# Patient Record
Sex: Female | Born: 1970 | Race: Black or African American | Hispanic: No | Marital: Single | Smoking: Never smoker
Health system: Southern US, Community
[De-identification: ages and names within clinical notes are randomized; demographics above are authoritative.]

## PROBLEM LIST (undated history)

## (undated) DIAGNOSIS — K769 Liver disease, unspecified: Secondary | ICD-10-CM

## (undated) HISTORY — PX: CHOLECYSTECTOMY: SHX55

## (undated) HISTORY — PX: ABDOMINAL HYSTERECTOMY: SHX81

---

## 2016-02-07 ENCOUNTER — Encounter (HOSPITAL_BASED_OUTPATIENT_CLINIC_OR_DEPARTMENT_OTHER): Payer: Self-pay | Admitting: *Deleted

## 2016-02-07 ENCOUNTER — Emergency Department (HOSPITAL_BASED_OUTPATIENT_CLINIC_OR_DEPARTMENT_OTHER)
Admission: EM | Admit: 2016-02-07 | Discharge: 2016-02-07 | Disposition: A | Discharge status: Home / Self Care | Payer: Medicaid Other | Attending: Emergency Medicine | Admitting: Emergency Medicine

## 2016-02-07 ENCOUNTER — Emergency Department (HOSPITAL_BASED_OUTPATIENT_CLINIC_OR_DEPARTMENT_OTHER): Payer: Medicaid Other

## 2016-02-07 DIAGNOSIS — R05 Cough: Secondary | ICD-10-CM | POA: Insufficient documentation

## 2016-02-07 NOTE — ED Notes (Signed)
Cough and URI sx x 3 weeks

## 2016-11-19 ENCOUNTER — Encounter (HOSPITAL_BASED_OUTPATIENT_CLINIC_OR_DEPARTMENT_OTHER): Payer: Self-pay | Admitting: *Deleted

## 2016-11-19 ENCOUNTER — Emergency Department (HOSPITAL_BASED_OUTPATIENT_CLINIC_OR_DEPARTMENT_OTHER)
Admission: EM | Admit: 2016-11-19 | Discharge: 2016-11-19 | Disposition: A | Discharge status: Home / Self Care | Payer: Commercial Managed Care - HMO | Attending: Emergency Medicine | Admitting: Emergency Medicine

## 2016-11-19 DIAGNOSIS — H1033 Unspecified acute conjunctivitis, bilateral: Secondary | ICD-10-CM | POA: Diagnosis not present

## 2016-11-19 DIAGNOSIS — J069 Acute upper respiratory infection, unspecified: Secondary | ICD-10-CM | POA: Diagnosis not present

## 2016-11-19 DIAGNOSIS — R05 Cough: Secondary | ICD-10-CM | POA: Diagnosis present

## 2016-11-19 DIAGNOSIS — B9789 Other viral agents as the cause of diseases classified elsewhere: Secondary | ICD-10-CM

## 2016-11-19 HISTORY — DX: Liver disease, unspecified: K76.9

## 2016-11-19 MED ORDER — ERYTHROMYCIN 5 MG/GM OP OINT: TOPICAL_OINTMENT | OPHTHALMIC | 0 refills | Status: DC

## 2016-11-19 MED ORDER — BENZONATATE 100 MG PO CAPS: 100.0000 mg | ORAL_CAPSULE | Freq: Three times a day (TID) | ORAL | 0 refills | Status: DC

## 2016-11-19 NOTE — Discharge Instructions (Signed)
Take tylenol 2 pills 4 times a day and motrin 4 pills 3 times a day.  Drink plenty of fluids.  Return for worsening shortness of breath, headache, confusion. Follow up with your family doctor.   

## 2016-11-19 NOTE — ED Triage Notes (Signed)
Pt reports cough and sinus congestion x over one week. Cough was productive last week, now is unable to experctorate. Has not used any otc meds. Denies any fevers.

## 2016-11-19 NOTE — ED Notes (Signed)
ED Provider at bedside. 

## 2016-11-19 NOTE — ED Provider Notes (Signed)
[facility]   CSN: [id number] Arrival date & time: 11/19/16  0750     History   Chief Complaint Chief Complaint  Patient presents with  . Cough    HPI Heather Bass is a 45 y.o. female.  [name] with a chief complaint of cough congestion. Going on for the past week or so. Now is nonproductive. Denies fevers denies shortness breath. Has some mild facial swelling. Denies sore throat denies vomiting or diarrhea. Having bilateral eye discharge. Is thick in the morning and has to open her eyes with her hands. Unsure of sick contacts.   The history is provided by the patient.  Cough  This is a new problem. The current episode started more than 1 week ago. The problem occurs constantly. The problem has not changed since onset.The cough is non-productive. There has been no fever. Associated symptoms include rhinorrhea and eye redness. Pertinent negatives include no chest pain, no chills, no sweats, no ear congestion, no ear pain, no headaches, no sore throat, no myalgias, no shortness of breath and no wheezing. She has tried cough syrup and decongestants for the symptoms. The treatment provided mild relief. She is not a smoker.    Past Medical History:  Diagnosis Date  . Liver disorder     There are no active problems to display for this patient.   Past Surgical History:  Procedure Laterality Date  . ABDOMINAL HYSTERECTOMY    . CHOLECYSTECTOMY      OB History    No data available       Home Medications    Prior to Admission medications   Medication Sig Start Date End Date Taking? Authorizing Provider  benzonatate (TESSALON) 100 MG capsule Take 1 capsule (100 mg total) by mouth every 8 (eight) hours. 11/19/16   Melene Planan Liller Yohn, DO  erythromycin ophthalmic ointment Place a 1/2 inch ribbon of ointment into the lower eyelid four times a day for 7 days. 11/19/16   Melene Planan Meaghann Choo, DO    Family History History reviewed. No pertinent family history.  Social  History Social History  Substance Use Topics  . Smoking status: Never Smoker  . Smokeless tobacco: Never Used  . Alcohol use Yes     Comment: beer occasionally     Allergies   Patient has no known allergies.   Review of Systems Review of Systems  Constitutional: Negative for chills and fever.  HENT: Positive for congestion, facial swelling and rhinorrhea. Negative for ear pain and sore throat.   Eyes: Positive for redness. Negative for visual disturbance.  Respiratory: Positive for cough. Negative for shortness of breath and wheezing.   Cardiovascular: Negative for chest pain and palpitations.  Gastrointestinal: Negative for nausea and vomiting.  Genitourinary: Negative for dysuria and urgency.  Musculoskeletal: Negative for arthralgias and myalgias.  Skin: Negative for pallor and wound.  Neurological: Negative for dizziness and headaches.     Physical Exam Updated Vital Signs BP (!) 155/105 (BP Location: Left Arm)   Pulse 90   Temp 97.7 F (36.5 C) (Oral)   Resp 18   Ht 5\' 11"  (1.803 m)   Wt 239 lb (108.4 kg)   SpO2 97%   BMI 33.33 kg/m   Physical Exam  Constitutional: She is oriented to person, place, and time. She appears well-developed and well-nourished. No distress.  HENT:  Head: Normocephalic and atraumatic.  Swollen turbinates, posterior nasal drip, no noted sinus ttp, tm normal bilaterally.   Vesicles noted in and  around mouth   Eyes: EOM are normal. Pupils are equal, round, and reactive to light. Right eye exhibits discharge. Left eye exhibits discharge.  Injection bilaterally  Neck: Normal range of motion. Neck supple.  Cardiovascular: Normal rate and regular rhythm.  Exam reveals no gallop and no friction rub.   No murmur heard. Pulmonary/Chest: Effort normal. She has no wheezes. She has no rales.  Abdominal: Soft. She exhibits no distension. There is no tenderness.  Musculoskeletal: She exhibits no edema or tenderness.  Neurological: She is alert  and oriented to person, place, and time.  Skin: Skin is warm and dry. She is not diaphoretic.  Psychiatric: She has a normal mood and affect. Her behavior is normal.  Nursing note and vitals reviewed.    ED Treatments / Results  Labs (all labs ordered are listed, but only abnormal results are displayed) Labs Reviewed - No data to display  EKG  EKG Interpretation None       Radiology No results found.  Procedures Procedures (including critical care time)  Medications Ordered in ED Medications - No data to display   Initial Impression / Assessment and Plan / ED Course  I have reviewed the triage vital signs and the nursing notes.  Pertinent labs & imaging results that were available during my care of the patient were reviewed by me and considered in my medical decision making (see chart for details).  Clinical Course     [name] With a chief complaint of cough and congestion. Going on for the past week. Clinically has conjunctivitis. Will treat with erythromycin ointment. Clear lung sounds increased work of breathing. Suspect likely early hand-foot-and-mouth disease with vesicular lesions in the mouth and on the face. Will defer x-ray at this time without fever. PCP follow-up.    Final Clinical Impressions(s) / ED Diagnoses   Final diagnoses:  Viral URI with cough  Acute conjunctivitis of both eyes, unspecified acute conjunctivitis type    New Prescriptions New Prescriptions   BENZONATATE (TESSALON) 100 MG CAPSULE    Take 1 capsule (100 mg total) by mouth every 8 (eight) hours.   ERYTHROMYCIN OPHTHALMIC OINTMENT    Place a 1/2 inch ribbon of ointment into the lower eyelid four times a day for 7 days.     Melene Planan Monterio Bob, DO 11/19/16 [phone]

## 2017-07-26 ENCOUNTER — Emergency Department (HOSPITAL_BASED_OUTPATIENT_CLINIC_OR_DEPARTMENT_OTHER)
Admission: EM | Admit: 2017-07-26 | Discharge: 2017-07-26 | Disposition: A | Discharge status: Home / Self Care | Payer: Medicare Other | Attending: Emergency Medicine | Admitting: Emergency Medicine

## 2017-07-26 ENCOUNTER — Encounter (HOSPITAL_BASED_OUTPATIENT_CLINIC_OR_DEPARTMENT_OTHER): Payer: Self-pay | Admitting: Emergency Medicine

## 2017-07-26 DIAGNOSIS — A599 Trichomoniasis, unspecified: Secondary | ICD-10-CM | POA: Diagnosis not present

## 2017-07-26 DIAGNOSIS — N3000 Acute cystitis without hematuria: Secondary | ICD-10-CM | POA: Insufficient documentation

## 2017-07-26 DIAGNOSIS — B9689 Other specified bacterial agents as the cause of diseases classified elsewhere: Secondary | ICD-10-CM

## 2017-07-26 DIAGNOSIS — N760 Acute vaginitis: Secondary | ICD-10-CM | POA: Diagnosis not present

## 2017-07-26 DIAGNOSIS — N898 Other specified noninflammatory disorders of vagina: Secondary | ICD-10-CM | POA: Diagnosis present

## 2017-07-26 DIAGNOSIS — Z79899 Other long term (current) drug therapy: Secondary | ICD-10-CM | POA: Diagnosis not present

## 2017-07-26 LAB — URINALYSIS, MICROSCOPIC (REFLEX)

## 2017-07-26 LAB — URINALYSIS, ROUTINE W REFLEX MICROSCOPIC
Bilirubin Urine: NEGATIVE
Glucose, UA: NEGATIVE mg/dL
Hgb urine dipstick: NEGATIVE
KETONES UR: NEGATIVE mg/dL
NITRITE: NEGATIVE
PH: 6.5 (ref 5.0–8.0)
Protein, ur: NEGATIVE mg/dL
Specific Gravity, Urine: 1.014 (ref 1.005–1.030)

## 2017-07-26 LAB — WET PREP, GENITAL
Sperm: NONE SEEN
YEAST WET PREP: NONE SEEN

## 2017-07-26 MED ORDER — METRONIDAZOLE 500 MG PO TABS: 500.0000 mg | ORAL_TABLET | Freq: Two times a day (BID) | ORAL | 0 refills | Status: DC

## 2017-07-26 MED ORDER — CEPHALEXIN 500 MG PO CAPS: 500.0000 mg | ORAL_CAPSULE | Freq: Four times a day (QID) | ORAL | 0 refills | Status: DC

## 2017-07-26 NOTE — ED Provider Notes (Signed)
[facility]   CSN: [id number] Arrival date & time: 07/26/17  0825     History   Chief Complaint Chief Complaint  Patient presents with  . Vaginal Discharge    HPI Heather Bass is a 46 y.o. female who presents with 4 days of white vaginal discharge and dysuria. Patient states that the discharge is white and malodorous. Patient also reports some dysuria that has been ongoing. Patient states that she was last sexually active 3 weeks ago. Patient has one partner. They do not use any protection and patient is on any birth control. Patient has a history of STDs cannot recall which one. She was treated several years ago. Patient denies any fevers/chills, chest pain, difficulty breathing, abdominal pain, hematuria, vaginal bleeding.  The history is provided by the patient.    Past Medical History:  Diagnosis Date  . Liver disorder     There are no active problems to display for this patient.   Past Surgical History:  Procedure Laterality Date  . ABDOMINAL HYSTERECTOMY    . CHOLECYSTECTOMY      OB History    No data available       Home Medications    Prior to Admission medications   Medication Sig Start Date End Date Taking? Authorizing Provider  citalopram (CELEXA) 20 MG tablet Take 20 mg by mouth daily.   Yes [provider]  furosemide (LASIX) 20 MG tablet Take 20 mg by mouth.   Yes [provider]  predniSONE (DELTASONE) 20 MG tablet Take 20 mg by mouth daily with breakfast.   Yes [provider]  spironolactone (ALDACTONE) 50 MG tablet Take 50 mg by mouth daily.   Yes [provider]  cephALEXin (KEFLEX) 500 MG capsule Take 1 capsule (500 mg total) by mouth 4 (four) times daily. 07/26/17   Maxwell CaulLayden, Charlyne Robertshaw A, PA-C  metroNIDAZOLE (FLAGYL) 500 MG tablet Take 1 tablet (500 mg total) by mouth 2 (two) times daily. 07/26/17   Maxwell CaulLayden, Lamberto Dinapoli A, PA-C    Family History No family history on file.  Social  History Social History  Substance Use Topics  . Smoking status: Never Smoker  . Smokeless tobacco: Never Used  . Alcohol use Yes     Comment: beer occasionally     Allergies   Patient has no known allergies.   Review of Systems Review of Systems  Constitutional: Negative for fever.  Respiratory: Negative for shortness of breath.   Cardiovascular: Negative for chest pain.  Gastrointestinal: Negative for abdominal pain, nausea and vomiting.  Genitourinary: Positive for vaginal discharge. Negative for dysuria, hematuria, vaginal bleeding and vaginal pain.     Physical Exam Updated Vital Signs BP 130/88   Pulse 85   Temp 97.6 F (36.4 C) (Oral)   Resp 16   Ht 5\' 11"  (1.803 m)   Wt 95.3 kg (210 lb)   SpO2 99%   BMI 29.29 kg/m   Physical Exam  Constitutional: She is oriented to person, place, and time. She appears well-developed and well-nourished.  Sitting comfortably on examination table  HENT:  Head: Normocephalic and atraumatic.  Mouth/Throat: Oropharynx is clear and moist and mucous membranes are normal.  Eyes: Pupils are equal, round, and reactive to light. Conjunctivae, EOM and lids are normal.  Neck: Full passive range of motion without pain.  Cardiovascular: Normal rate, regular rhythm, normal heart sounds and normal pulses.  Exam reveals no gallop and no friction rub.   No murmur heard. Pulmonary/Chest:  Effort normal and breath sounds normal.  Abdominal: Soft. Normal appearance. There is no tenderness. There is no rigidity, no guarding and no CVA tenderness (bilaterally).  Genitourinary: Cervix exhibits no motion tenderness and no friability. Right adnexum displays no mass and no tenderness. Left adnexum displays no mass and no tenderness. Vaginal discharge found.  Genitourinary Comments: The exam was performed with a chaperone present. Normal external female genitalia. No lesions, rash, or sores. White, malodorous discharge noted in the vaginal canal. Cervix is  without friability. No CMT. No adnexal mass, tenderness.   Musculoskeletal: Normal range of motion.  Neurological: She is alert and oriented to person, place, and time.  Skin: Skin is warm and dry. Capillary refill takes less than 2 seconds.  Psychiatric: She has a normal mood and affect. Her speech is normal.  Nursing note and vitals reviewed.    ED Treatments / Results  Labs (all labs ordered are listed, but only abnormal results are displayed) Labs Reviewed  WET PREP, GENITAL - Abnormal; Notable for the following:       Result Value   Trich, Wet Prep PRESENT (*)    Clue Cells Wet Prep HPF POC PRESENT (*)    WBC, Wet Prep HPF POC MANY (*)    All other components within normal limits  URINALYSIS, ROUTINE W REFLEX MICROSCOPIC - Abnormal; Notable for the following:    Leukocytes, UA MODERATE (*)    All other components within normal limits  URINALYSIS, MICROSCOPIC (REFLEX) - Abnormal; Notable for the following:    Bacteria, UA FEW (*)    Squamous Epithelial / LPF 0-5 (*)    All other components within normal limits  HIV ANTIBODY (ROUTINE TESTING)  RPR  GC/CHLAMYDIA PROBE AMP ([place]) NOT AT [facility]    EKG  EKG Interpretation None       Radiology No results found.  Procedures Procedures (including critical care time)  Medications Ordered in ED Medications - No data to display   Initial Impression / Assessment and Plan / ED Course  I have reviewed the triage vital signs and the nursing notes.  Pertinent labs & imaging results that were available during my care of the patient were reviewed by me and considered in my medical decision making (see chart for details).     Were 46-year-old female who presents with 4 days of white vaginal discharge and dysuria. No fevers/chills. No abdominal pain. No nausea/vomiting. Patient also requesting to be checked for STDs while here. Patient is afebrile, non-toxic appearing, sitting comfortably on examination table. Vital signs  reviewed and stable. Consider UTI versus STD versus BV. Plan to obtain UA for evaluation. Will also plan to do pelvic for further evaluation.  Pelvic as documented above. No evidence of cervical friability. No CMT. Pelvic is not concerning for PID. There was white, malodorous vaginal discharge, concerning for BV.   Patient unable to provide a urine sample at this time. Will in and out cath for urine sample.  Wet prep reviewed. Positive for BV and Trichomonas. Will plan to treat accordingly. UA shows some leukocytes, pyuria, bacteriuria.  Patient has history of autoimmune hepatitis. She is followed by [facility]. Records reviewed show that patient has reached the labs in July 2018 that showed normal AST and ALT. no hepatic dosing of Flagyl indicated at this time.  Discussed results with patient. Instructed patient follow-up with her primary care doctor in 1 week to have her labs, specifically her liver functions rechecked. Return precautions discussed. Patient  expresses understanding and agreement plan.  Final Clinical Impressions(s) / ED Diagnoses   Final diagnoses:  BV (bacterial vaginosis)  Trichomonas infection  Acute cystitis without hematuria    New Prescriptions Discharge Medication List as of 07/26/2017 12:14 PM    START taking these medications   Details  cephALEXin (KEFLEX) 500 MG capsule Take 1 capsule (500 mg total) by mouth 4 (four) times daily., Starting Mon 07/26/2017, Print    metroNIDAZOLE (FLAGYL) 500 MG tablet Take 1 tablet (500 mg total) by mouth 2 (two) times daily., Starting Mon 07/26/2017, Print         Maxwell Caul, PA-C 07/26/17 1746    Arby Barrette, MD 08/05/17 [phone]

## 2017-07-26 NOTE — ED Triage Notes (Signed)
Pt wants to be checked for STD's.  Pt having vaginal discharge, foul smell, and dysuria for three days.  No fever.

## 2017-07-26 NOTE — Discharge Instructions (Signed)
As we discussed, you will be notified in a few days if the remaining labs are abnormal. If he do not hear anything that is continues.  Take Flagyl as directed.  It is very important that you do not consume any alcohol while taking this medication as it will cause you to become violently ill.  Take the other antibiotic as prescribed.  As we discussed, you need to follow up with her primary care doctor in 1 week to have her labs redrawn. Specifically they need to check her liver functions to make sure that they're normal.  Return to the emergency department for any worsening pain, fever, nausea/vomiting, difficulty breathing or any other worsening or concerning symptoms.

## 2017-07-27 LAB — RPR, QUANT+TP ABS (REFLEX): T Pallidum Abs: POSITIVE — AB

## 2017-07-27 LAB — HIV ANTIBODY (ROUTINE TESTING W REFLEX): HIV SCREEN 4TH GENERATION: NONREACTIVE

## 2017-07-27 LAB — RPR: RPR Ser Ql: REACTIVE — AB

## 2017-07-28 LAB — GC/CHLAMYDIA PROBE AMP (~~LOC~~) NOT AT ARMC
CHLAMYDIA, DNA PROBE: NEGATIVE
Neisseria Gonorrhea: NEGATIVE

## 2018-02-06 ENCOUNTER — Encounter (HOSPITAL_BASED_OUTPATIENT_CLINIC_OR_DEPARTMENT_OTHER): Payer: Self-pay | Admitting: Emergency Medicine

## 2018-02-06 ENCOUNTER — Other Ambulatory Visit: Payer: Self-pay

## 2018-02-06 ENCOUNTER — Emergency Department (HOSPITAL_BASED_OUTPATIENT_CLINIC_OR_DEPARTMENT_OTHER)
Admission: EM | Admit: 2018-02-06 | Discharge: 2018-02-06 | Disposition: A | Discharge status: Home / Self Care | Payer: Medicare Other | Attending: Emergency Medicine | Admitting: Emergency Medicine

## 2018-02-06 DIAGNOSIS — Z79899 Other long term (current) drug therapy: Secondary | ICD-10-CM | POA: Insufficient documentation

## 2018-02-06 DIAGNOSIS — R1110 Vomiting, unspecified: Secondary | ICD-10-CM | POA: Diagnosis present

## 2018-02-06 DIAGNOSIS — R112 Nausea with vomiting, unspecified: Secondary | ICD-10-CM | POA: Diagnosis not present

## 2018-02-06 DIAGNOSIS — R1013 Epigastric pain: Secondary | ICD-10-CM | POA: Diagnosis not present

## 2018-02-06 MED ORDER — ONDANSETRON 8 MG PO TBDP
8.0000 mg | ORAL_TABLET | Freq: Once | ORAL | Status: AC
  Administered 2018-02-06: 8 mg via ORAL
  Filled 2018-02-06: qty 1

## 2018-02-06 MED ORDER — ONDANSETRON 4 MG PO TBDP: 4.0000 mg | ORAL_TABLET | ORAL | 0 refills | Status: AC | PRN

## 2018-02-06 MED ORDER — PANTOPRAZOLE SODIUM 20 MG PO TBEC
20.0000 mg | DELAYED_RELEASE_TABLET | Freq: Once | ORAL | Status: DC
  Filled 2018-02-06: qty 1

## 2018-02-06 MED ORDER — ONDANSETRON 4 MG PO TBDP
4.0000 mg | ORAL_TABLET | Freq: Once | ORAL | Status: AC
  Administered 2018-02-06: 4 mg via ORAL
  Filled 2018-02-06: qty 1

## 2018-02-06 MED ORDER — PANTOPRAZOLE SODIUM 40 MG PO TBEC
DELAYED_RELEASE_TABLET | ORAL | Status: AC
  Filled 2018-02-06: qty 1

## 2018-02-06 MED ORDER — PANTOPRAZOLE SODIUM 20 MG PO TBEC: 20.0000 mg | DELAYED_RELEASE_TABLET | Freq: Every day | ORAL | 0 refills | Status: AC

## 2018-02-06 MED ORDER — PANTOPRAZOLE SODIUM 40 MG PO TBEC
40.0000 mg | DELAYED_RELEASE_TABLET | Freq: Once | ORAL | Status: AC
  Administered 2018-02-06: 40 mg via ORAL

## 2018-02-06 NOTE — ED Triage Notes (Addendum)
Pt reports vomiting that started Saturday (yesterday) afternoon. Reports ~ 6 episodes, and "a little fever", and "a little bit" of abd pain. Pt also reports chest pain.

## 2018-02-06 NOTE — ED Provider Notes (Signed)
[facility] EMERGENCY DEPARTMENT Provider Note   CSN: [id number] Arrival date & time: 02/06/18  0004     History   Chief Complaint Chief Complaint  Patient presents with  . Vomiting    HPI Heather Bass is a 47 y.o. female.  HPI Reports was lying across her bed yesterday midday.  Quite suddenly she felt nauseated and went into the bathroom and threw up several times.  She then laid down again and reports she rested for a while.  Then got a second episode of vomiting that she reports lasted for about 30 minutes.  Patient reports she had some epigastric pain.  Possible subjective fever.  No diarrhea.  Patient had very prolonged ED wait time.  She has been here for 7 hours.  She reports no further vomiting since coming to the emergency department.  She reports that at one point some pain radiated up into her chest.  Had burning quality.  At this time, she reports some mild diffuse abdominal discomfort.  No further nausea.  History of autoimmune cirrhosis.  He does see a gastroenterologist.  She reports she is compliant with her medications. Past Medical History:  Diagnosis Date  . Liver disorder     There are no active problems to display for this patient.   Past Surgical History:  Procedure Laterality Date  . ABDOMINAL HYSTERECTOMY    . CHOLECYSTECTOMY      OB History    No data available       Home Medications    Prior to Admission medications   Medication Sig Start Date End Date Taking? Authorizing Provider  cephALEXin (KEFLEX) 500 MG capsule Take 1 capsule (500 mg total) by mouth 4 (four) times daily. 07/26/17   Maxwell CaulLayden, Lindsey A, PA-C  citalopram (CELEXA) 20 MG tablet Take 20 mg by mouth daily.    [provider]  furosemide (LASIX) 20 MG tablet Take 20 mg by mouth.    [provider]  metroNIDAZOLE (FLAGYL) 500 MG tablet Take 1 tablet (500 mg total) by mouth 2 (two) times daily. 07/26/17   Maxwell CaulLayden, Lindsey A, PA-C  ondansetron (ZOFRAN ODT) 4  MG disintegrating tablet Take 1 tablet (4 mg total) by mouth every 4 (four) hours as needed for nausea or vomiting. 02/06/18   Arby BarrettePfeiffer, Shavonte Zhao, MD  pantoprazole (PROTONIX) 20 MG tablet Take 1 tablet (20 mg total) by mouth daily. 02/06/18   Arby BarrettePfeiffer, Kensey Luepke, MD  predniSONE (DELTASONE) 20 MG tablet Take 20 mg by mouth daily with breakfast.    [provider]  spironolactone (ALDACTONE) 50 MG tablet Take 50 mg by mouth daily.    [provider]    Family History No family history on file.  Social History Social History   Tobacco Use  . Smoking status: Never Smoker  . Smokeless tobacco: Never Used  Substance Use Topics  . Alcohol use: Yes    Comment: beer occasionally  . Drug use: No     Allergies   Patient has no known allergies.   Review of Systems Review of Systems 10 Systems reviewed and are negative for acute change except as noted in the HPI.   Physical Exam Updated Vital Signs BP 98/67 (BP Location: Left Arm)   Pulse 68   Temp 98.4 F (36.9 C) (Oral)   Resp 16   Ht 5\' 11"  (1.803 m)   Wt 106.1 kg (233 lb 14.4 oz)   SpO2 100%   BMI 32.62 kg/m   Physical Exam  Constitutional: She appears well-developed and well-nourished. No distress.  Patient is resting quietly as I enter the room.  She is alert and nontoxic.  No distress.  HENT:  Head: Normocephalic and atraumatic.  Mouth/Throat: Oropharynx is clear and moist.  Eyes: Conjunctivae and EOM are normal.  No scleral icterus.  Neck: Neck supple.  Cardiovascular: Normal rate, regular rhythm, normal heart sounds and intact distal pulses.  No murmur heard. Pulmonary/Chest: Effort normal and breath sounds normal. No respiratory distress.  Abdominal: Soft. Bowel sounds are normal. She exhibits no distension. There is no tenderness. There is no guarding.  Patient's abdomen is nondistended.  Soft.  Patient endorses mild discomfort diffusely.  Mildly more epigastrium as compared to lower abdomen.    Musculoskeletal: Normal range of motion. She exhibits edema. She exhibits no tenderness.  Trace edema bilateral lower extremities.  Neurological: She is alert. She exhibits normal muscle tone. Coordination normal.  Patient's mental status is clear.  Her speech is normal with situationally appropriate conversation.  No evidence of somnolence or cognitive dysfunction.  Skin: Skin is warm and dry.  Psychiatric: She has a normal mood and affect.  Nursing note and vitals reviewed.    ED Treatments / Results  Labs (all labs ordered are listed, but only abnormal results are displayed) Labs Reviewed - No data to display  EKG  EKG Interpretation  Date/Time:  Sunday February 06 2018 00:25:14 EST Ventricular Rate:  99 PR Interval:  168 QRS Duration: 82 QT Interval:  358 QTC Calculation: 459 R Axis:   70 Text Interpretation:  Normal sinus rhythm Possible Left atrial enlargement Borderline ECG No previous ECGs available Confirmed by Molpus, John ([id number]) on 02/06/2018 1:02:33 AM       Radiology No results found.  Procedures Procedures (including critical care time)  Medications Ordered in ED Medications  ondansetron (ZOFRAN-ODT) disintegrating tablet 4 mg (not administered)  pantoprazole (PROTONIX) EC tablet 40 mg (not administered)  ondansetron (ZOFRAN-ODT) disintegrating tablet 8 mg (8 mg Oral Given 02/06/18 0617)     Initial Impression / Assessment and Plan / ED Course  I have reviewed the triage vital signs and the nursing notes.  Pertinent labs & imaging results that were available during my care of the patient were reviewed by me and considered in my medical decision making (see chart for details).     Final Clinical Impressions(s) / ED Diagnoses   Final diagnoses:  Non-intractable vomiting with nausea, unspecified vomiting type   Patient is now asymptomatic.  She has had prolonged wait time in the emergency department.  Last episode of vomiting was greater than 10  hours ago.  Abdominal examination is nonsurgical.  Patient is already had cholecystectomy and hysterectomy.  She has no localizing right upper quadrant tenderness or apparent ascites on exam.  Clinically, no sign of acute exacerbation of hepatitis.  We have made shared decision making plan of symptomatic treatment with PPI and antiemetic.  Patient is counseled on signs and symptoms where she will return.  She is counseled on follow-up with her gastroenterologist this upcoming week. ED Discharge Orders        Ordered    ondansetron (ZOFRAN ODT) 4 MG disintegrating tablet  Every 4 hours PRN     02/06/18 0718    pantoprazole (PROTONIX) 20 MG tablet  Daily     02 /24/19 1610       Arby Barrette, MD 02/06/18 [id number]

## 2018-02-06 NOTE — ED Notes (Signed)
Alert, NAD, calm, interactive, resps e/u, speaking in clear complete sentences, no dyspnea noted, skin W&D, VSS, c/o nausea, (denies: pain, sob, dizziness or visual changes).

## 2018-02-06 NOTE — ED Notes (Signed)
Pt complaining of chest pain again. Pt concerned on where to charge her phone before getting undressed to get a repeat of vitals.

## 2018-06-02 ENCOUNTER — Other Ambulatory Visit: Payer: Self-pay

## 2018-06-02 ENCOUNTER — Encounter (HOSPITAL_COMMUNITY): Payer: Self-pay | Admitting: *Deleted

## 2018-06-02 ENCOUNTER — Emergency Department (HOSPITAL_COMMUNITY)
Admission: EM | Admit: 2018-06-02 | Discharge: 2018-06-02 | Disposition: A | Discharge status: Home / Self Care | Payer: Medicare Other | Attending: Emergency Medicine | Admitting: Emergency Medicine

## 2018-06-02 ENCOUNTER — Emergency Department (HOSPITAL_COMMUNITY): Payer: Medicare Other

## 2018-06-02 DIAGNOSIS — Z79899 Other long term (current) drug therapy: Secondary | ICD-10-CM | POA: Insufficient documentation

## 2018-06-02 DIAGNOSIS — R609 Edema, unspecified: Secondary | ICD-10-CM | POA: Diagnosis not present

## 2018-06-02 DIAGNOSIS — R2243 Localized swelling, mass and lump, lower limb, bilateral: Secondary | ICD-10-CM | POA: Diagnosis present

## 2018-06-02 LAB — CBC WITH DIFFERENTIAL/PLATELET
Abs Immature Granulocytes: 0 10*3/uL (ref 0.0–0.1)
BASOS PCT: 1 %
Basophils Absolute: 0 10*3/uL (ref 0.0–0.1)
EOS ABS: 0.5 10*3/uL (ref 0.0–0.7)
EOS PCT: 8 %
HEMATOCRIT: 36.7 % (ref 36.0–46.0)
Hemoglobin: 11.6 g/dL — ABNORMAL LOW (ref 12.0–15.0)
Immature Granulocytes: 0 %
LYMPHS ABS: 2.5 10*3/uL (ref 0.7–4.0)
Lymphocytes Relative: 40 %
MCH: 28.9 pg (ref 26.0–34.0)
MCHC: 31.6 g/dL (ref 30.0–36.0)
MCV: 91.3 fL (ref 78.0–100.0)
MONO ABS: 0.7 10*3/uL (ref 0.1–1.0)
Monocytes Relative: 11 %
NEUTROS PCT: 40 %
Neutro Abs: 2.6 10*3/uL (ref 1.7–7.7)
PLATELETS: 129 10*3/uL — AB (ref 150–400)
RBC: 4.02 MIL/uL (ref 3.87–5.11)
RDW: 12.9 % (ref 11.5–15.5)
WBC: 6.4 10*3/uL (ref 4.0–10.5)

## 2018-06-02 LAB — COMPREHENSIVE METABOLIC PANEL
ALT: 24 U/L (ref 14–54)
AST: 31 U/L (ref 15–41)
Albumin: 3.4 g/dL — ABNORMAL LOW (ref 3.5–5.0)
Alkaline Phosphatase: 208 U/L — ABNORMAL HIGH (ref 38–126)
Anion gap: 8 (ref 5–15)
BILIRUBIN TOTAL: 0.5 mg/dL (ref 0.3–1.2)
BUN: 6 mg/dL (ref 6–20)
CO2: 24 mmol/L (ref 22–32)
Calcium: 9.1 mg/dL (ref 8.9–10.3)
Chloride: 109 mmol/L (ref 101–111)
Creatinine, Ser: 0.96 mg/dL (ref 0.44–1.00)
Glucose, Bld: 89 mg/dL (ref 65–99)
Potassium: 3.5 mmol/L (ref 3.5–5.1)
Sodium: 141 mmol/L (ref 135–145)
TOTAL PROTEIN: 7.6 g/dL (ref 6.5–8.1)

## 2018-06-02 LAB — BRAIN NATRIURETIC PEPTIDE: B NATRIURETIC PEPTIDE 5: 17.7 pg/mL (ref 0.0–100.0)

## 2018-06-02 NOTE — Discharge Instructions (Signed)
Try to elevate your legs to the level of your heart whenever you can.  Follow-up with your family physician.

## 2018-06-02 NOTE — ED Notes (Signed)
Pt in xray

## 2018-06-02 NOTE — ED Provider Notes (Signed)
[facility] EMERGENCY DEPARTMENT Provider Note   CSN: [id number] Arrival date & time: 06/02/18  0117     History   Chief Complaint Chief Complaint  Patient presents with  . Leg Swelling    HPI Heather Bass is a 47 y.o. female.  47 yo F with a chief complaint of leg swelling.  Is been going on for the past couple weeks.  She feels like it started on the right and then now is bilateral.  Per her family member she works on her feet.  Has been working every day for the past week or so.  She denies chest pain shortness of breath denies history of issue with her liver or kidneys.  Denies history of heart failure.  Denies orthopnea or PND.  The history is provided by the patient and a relative.  Illness  This is a new problem. The current episode started more than 1 week ago. The problem occurs constantly. The problem has not changed since onset.Pertinent negatives include no chest pain, no headaches and no shortness of breath. Nothing aggravates the symptoms. Nothing relieves the symptoms. She has tried nothing for the symptoms. The treatment provided no relief.    Past Medical History:  Diagnosis Date  . Liver disorder     There are no active problems to display for this patient.   Past Surgical History:  Procedure Laterality Date  . ABDOMINAL HYSTERECTOMY    . CHOLECYSTECTOMY       OB History   None      Home Medications    Prior to Admission medications   Medication Sig Start Date End Date Taking? Authorizing Provider  acetaminophen (TYLENOL) 500 MG tablet Take 1,000 mg by mouth every 6 (six) hours as needed for mild pain.   Yes [provider]  citalopram (CELEXA) 20 MG tablet Take 20 mg by mouth daily.   Yes [provider]  furosemide (LASIX) 20 MG tablet Take 20 mg by mouth daily.    Yes [provider]  ondansetron (ZOFRAN ODT) 4 MG disintegrating tablet Take 1 tablet (4 mg total) by mouth every 4 (four) hours as  needed for nausea or vomiting. 02/06/18  Yes Pfeiffer, Jeannie Done, MD  cephALEXin (KEFLEX) 500 MG capsule Take 1 capsule (500 mg total) by mouth 4 (four) times daily. Patient not taking: Reported on 06/02/2018 07/26/17   [place]idence Lanius A, PA-C  metroNIDAZOLE (FLAGYL) 500 MG tablet Take 1 tablet (500 mg total) by mouth 2 (two) times daily. Patient not taking: Reported on 06/02/2018 07/26/17   [place] Lanius A, PA-C  pantoprazole (PROTONIX) 20 MG tablet Take 1 tablet (20 mg total) by mouth daily. Patient not taking: Reported on 06/02/2018 02/06/18   Charlesetta Shanks, MD    Family History No family history on file.  Social History Social History   Tobacco Use  . Smoking status: Never Smoker  . Smokeless tobacco: Never Used  Substance Use Topics  . Alcohol use: Yes    Comment: beer occasionally  . Drug use: No     Allergies   Patient has no known allergies.   Review of Systems Review of Systems  Constitutional: Negative for chills and fever.  HENT: Negative for congestion and rhinorrhea.   Eyes: Negative for redness and visual disturbance.  Respiratory: Negative for shortness of breath and wheezing.   Cardiovascular: Positive for leg swelling. Negative for chest pain and palpitations.  Gastrointestinal: Negative for nausea and vomiting.  Genitourinary: Negative for dysuria and  urgency.  Musculoskeletal: Negative for arthralgias and myalgias.  Skin: Negative for pallor and wound.  Neurological: Negative for dizziness and headaches.     Physical Exam Updated Vital Signs BP (!) 129/98 (BP Location: Right Arm)   Pulse 87   Temp 98.1 F (36.7 C) (Oral)   Resp 20   Ht 5' 11"  (1.803 m)   SpO2 95%   BMI 32.62 kg/m   Physical Exam  Constitutional: She is oriented to person, place, and time. She appears well-developed and well-nourished. No distress.  HENT:  Head: Normocephalic and atraumatic.  Eyes: Pupils are equal, round, and reactive to light. EOM are normal.  Neck: Normal  range of motion. Neck supple.  Cardiovascular: Normal rate and regular rhythm. Exam reveals no gallop and no friction rub.  No murmur heard. Pulmonary/Chest: Effort normal. She has no wheezes. She has no rales.  Abdominal: Soft. She exhibits no distension. There is no tenderness.  Musculoskeletal: She exhibits edema (2+ BLE). She exhibits no tenderness.  Neurological: She is alert and oriented to person, place, and time.  Skin: Skin is warm and dry. She is not diaphoretic.  Psychiatric: She has a normal mood and affect. Her behavior is normal.  Nursing note and vitals reviewed.    ED Treatments / Results  Labs (all labs ordered are listed, but only abnormal results are displayed) Labs Reviewed  CBC WITH DIFFERENTIAL/PLATELET - Abnormal; Notable for the following components:      Result Value   Hemoglobin 11.6 (*)    Platelets 129 (*)    All other components within normal limits  COMPREHENSIVE METABOLIC PANEL - Abnormal; Notable for the following components:   Albumin 3.4 (*)    Alkaline Phosphatase 208 (*)    All other components within normal limits  BRAIN NATRIURETIC PEPTIDE    EKG EKG Interpretation  Date/Time:  Thursday June 02 2018 01:49:11 EDT Ventricular Rate:  76 PR Interval:    QRS Duration: 89 QT Interval:  389 QTC Calculation: 438 R Axis:   37 Text Interpretation:  Sinus rhythm No significant change since last tracing Reconfirmed by Deno Etienne [phone]) on 06/02/2018 1:56:35 AM   Radiology Dg Chest 2 View  Result Date: 06/02/2018 CLINICAL DATA:  Bilateral leg edema for 2 days. EXAM: CHEST - 2 VIEW COMPARISON:  None. FINDINGS: Shallow inspiration. Heart size and pulmonary vascularity are normal for technique. Linear atelectasis in the left mid lung. Lungs appear otherwise clear and expanded. No airspace disease or consolidation. No blunting of costophrenic angles. No pneumothorax. Calcification of the aorta. IMPRESSION: No evidence of active pulmonary disease.  Electronically Signed   By: Lucienne Capers M.D.   On: 06/02/2018 02:34    Procedures Procedures (including critical care time)  Medications Ordered in ED Medications - No data to display   Initial Impression / Assessment and Plan / ED Course  I have reviewed the triage vital signs and the nursing notes.  Pertinent labs & imaging results that were available during my care of the patient were reviewed by me and considered in my medical decision making (see chart for details).     47 yo F with a chief complaint of leg swelling.  Bilateral in nature.  Most likely dependent edema based on her history.  Will obtain LFTs renal function BNP chest x-ray.  Chest x-ray unremarkable as viewed by me.  CMP with mild elevation of the alk phos but otherwise unremarkable.  Renal function with creatinine of 0.96.  Not anemic.  Will discharge her home.  We will have her follow-up with her PCP.  4:02 AM:  I have discussed the diagnosis/risks/treatment options with the patient and family and believe the pt to be eligible for discharge home to follow-up with PCP. We also discussed returning to the ED immediately if new or worsening sx occur. We discussed the sx which are most concerning (e.g., sudden worsening pain, fever, inability to tolerate by mouth) that necessitate immediate return. Medications administered to the patient during their visit and any new prescriptions provided to the patient are listed below.  Medications given during this visit Medications - No data to display    The patient appears reasonably screen and/or stabilized for discharge and I doubt any other medical condition or other [facility] requiring further screening, evaluation, or treatment in the ED at this time prior to discharge.    Final Clinical Impressions(s) / ED Diagnoses   Final diagnoses:  Dependent edema    ED Discharge Orders    None       Deno Etienne, DO 06/02/18 [id number]

## 2018-06-02 NOTE — ED Triage Notes (Signed)
Pt has been having leg swelling x 1 month. Pt reports spraining her R ankle a year ago and is having pain in her L ankle now. Edema noted to BLE. Denies any new injury to ankles

## 2018-08-09 ENCOUNTER — Encounter (HOSPITAL_COMMUNITY): Payer: Self-pay | Admitting: Emergency Medicine

## 2018-08-09 ENCOUNTER — Other Ambulatory Visit: Payer: Self-pay

## 2018-08-09 ENCOUNTER — Emergency Department (HOSPITAL_COMMUNITY): Payer: Medicare Other

## 2018-08-09 ENCOUNTER — Emergency Department (HOSPITAL_COMMUNITY)
Admission: EM | Admit: 2018-08-09 | Discharge: 2018-08-09 | Disposition: A | Discharge status: Home / Self Care | Payer: Medicare Other | Source: Home / Self Care | Attending: Emergency Medicine | Admitting: Emergency Medicine

## 2018-08-09 DIAGNOSIS — N760 Acute vaginitis: Secondary | ICD-10-CM

## 2018-08-09 DIAGNOSIS — A419 Sepsis, unspecified organism: Secondary | ICD-10-CM | POA: Diagnosis not present

## 2018-08-09 DIAGNOSIS — J181 Lobar pneumonia, unspecified organism: Secondary | ICD-10-CM | POA: Diagnosis not present

## 2018-08-09 DIAGNOSIS — J9801 Acute bronchospasm: Secondary | ICD-10-CM

## 2018-08-09 DIAGNOSIS — J40 Bronchitis, not specified as acute or chronic: Secondary | ICD-10-CM

## 2018-08-09 DIAGNOSIS — N3000 Acute cystitis without hematuria: Secondary | ICD-10-CM | POA: Insufficient documentation

## 2018-08-09 DIAGNOSIS — B9689 Other specified bacterial agents as the cause of diseases classified elsewhere: Secondary | ICD-10-CM

## 2018-08-09 LAB — URINALYSIS, ROUTINE W REFLEX MICROSCOPIC
BILIRUBIN URINE: NEGATIVE
GLUCOSE, UA: NEGATIVE mg/dL
KETONES UR: NEGATIVE mg/dL
Nitrite: NEGATIVE
PROTEIN: NEGATIVE mg/dL
Specific Gravity, Urine: 1.011 (ref 1.005–1.030)
pH: 6 (ref 5.0–8.0)

## 2018-08-09 LAB — GC/CHLAMYDIA PROBE AMP (~~LOC~~) NOT AT ARMC
Chlamydia: NEGATIVE
Neisseria Gonorrhea: NEGATIVE

## 2018-08-09 LAB — WET PREP, GENITAL
SPERM: NONE SEEN
Trich, Wet Prep: NONE SEEN
YEAST WET PREP: NONE SEEN

## 2018-08-09 MED ORDER — METRONIDAZOLE 0.75 % VA GEL: 1.0000 | Freq: Two times a day (BID) | VAGINAL | 0 refills | Status: DC

## 2018-08-09 MED ORDER — ALBUTEROL SULFATE (2.5 MG/3ML) 0.083% IN NEBU
5.0000 mg | INHALATION_SOLUTION | Freq: Once | RESPIRATORY_TRACT | Status: AC
  Administered 2018-08-09: 5 mg via RESPIRATORY_TRACT
  Filled 2018-08-09: qty 6

## 2018-08-09 MED ORDER — LEVOFLOXACIN 500 MG PO TABS: 500.0000 mg | ORAL_TABLET | Freq: Every day | ORAL | 0 refills | Status: AC

## 2018-08-09 MED ORDER — IPRATROPIUM BROMIDE 0.02 % IN SOLN
0.5000 mg | Freq: Once | RESPIRATORY_TRACT | Status: AC
  Administered 2018-08-09: 0.5 mg via RESPIRATORY_TRACT
  Filled 2018-08-09: qty 2.5

## 2018-08-09 MED ORDER — ALBUTEROL SULFATE HFA 108 (90 BASE) MCG/ACT IN AERS: 1.0000 | INHALATION_SPRAY | Freq: Four times a day (QID) | RESPIRATORY_TRACT | 0 refills | Status: AC | PRN

## 2018-08-09 NOTE — ED Provider Notes (Signed)
[facility] EMERGENCY DEPARTMENT Provider Note   CSN: [id number] Arrival date & time: 08/09/18  0031     History   Chief Complaint Chief Complaint  Patient presents with  . URI  . Cough  . Exposure to STD    HPI Heather Bass is a 47 y.o. female.  Patient is here with complaint of URI symptoms including cough, congestion, sinus pressure and subjective fever for the past 5 days. She reports she does not have a primary care doctor so came here for evaluation and treatment. No history of asthma. She denies tobacco use. She reports vomiting related to forceful cough only. No abdominal pain or diarrhea. She also reports burning with urinary without frequency. She had a concerning vaginal discharge with odor last week but reports this has resolved.   The history is provided by the patient. No language interpreter was used.  URI   Associated symptoms include vomiting (See HPI.), dysuria, congestion and cough. Pertinent negatives include no chest pain, no abdominal pain and no diarrhea.  Cough  Associated symptoms include chills and shortness of breath. Pertinent negatives include no chest pain.  Exposure to STD  Associated symptoms include shortness of breath. Pertinent negatives include no chest pain and no abdominal pain.    Past Medical History:  Diagnosis Date  . Liver disorder     There are no active problems to display for this patient.   Past Surgical History:  Procedure Laterality Date  . ABDOMINAL HYSTERECTOMY    . CHOLECYSTECTOMY       OB History   None      Home Medications    Prior to Admission medications   Medication Sig Start Date End Date Taking? Authorizing Provider  cephALEXin (KEFLEX) 500 MG capsule Take 1 capsule (500 mg total) by mouth 4 (four) times daily. Patient not taking: Reported on 06/02/2018 07/26/17   Graciella Freer A, PA-C  metroNIDAZOLE (FLAGYL) 500 MG tablet Take 1 tablet (500 mg total) by mouth 2 (two) times  daily. Patient not taking: Reported on 06/02/2018 07/26/17   Graciella Freer A, PA-C  ondansetron (ZOFRAN ODT) 4 MG disintegrating tablet Take 1 tablet (4 mg total) by mouth every 4 (four) hours as needed for nausea or vomiting. Patient not taking: Reported on 08/09/2018 02/06/18   Arby Barrette, MD  pantoprazole (PROTONIX) 20 MG tablet Take 1 tablet (20 mg total) by mouth daily. Patient not taking: Reported on 06/02/2018 02/06/18   Arby Barrette, MD    Family History No family history on file.  Social History Social History   Tobacco Use  . Smoking status: Never Smoker  . Smokeless tobacco: Never Used  Substance Use Topics  . Alcohol use: Yes    Comment: beer occasionally  . Drug use: No     Allergies   Patient has no known allergies.   Review of Systems Review of Systems  Constitutional: Positive for chills and fatigue.  HENT: Positive for congestion.   Respiratory: Positive for cough and shortness of breath.   Cardiovascular: Negative for chest pain.  Gastrointestinal: Positive for vomiting (See HPI.). Negative for abdominal pain and diarrhea.  Genitourinary: Positive for dysuria and vaginal discharge. Negative for pelvic pain.  Neurological: Positive for weakness (Generalized weakness and malaise.).     Physical Exam Updated Vital Signs BP 118/80 (BP Location: Right Arm)   Pulse 99   Temp 98 F (36.7 C) (Oral)   Resp 15   Ht 5\' 11"  (1.803 m)  Wt 106.6 kg   SpO2 97%   BMI 32.78 kg/m   Physical Exam  Constitutional: She appears well-developed and well-nourished.  HENT:  Head: Normocephalic.  Neck: Normal range of motion. Neck supple.  Cardiovascular: Normal rate and regular rhythm.  Pulmonary/Chest: Effort normal. She has wheezes (Inspiratory and expiratory wheezing noted. ).  Abdominal: Soft. Bowel sounds are normal. There is no tenderness. There is no rebound and no guarding.  Genitourinary:  Genitourinary Comments: External vagina is unremarkable  without rash swelling or redness. There is vaginal discharge that is yellow, smooth, malodorous. No adnexal mass or tenderness. No CMT.  Musculoskeletal: Normal range of motion.  Neurological: She is alert.  Skin: Skin is warm and dry. No rash noted.  Psychiatric: She has a normal mood and affect.     ED Treatments / Results  Labs (all labs ordered are listed, but only abnormal results are displayed) Labs Reviewed  URINALYSIS, ROUTINE W REFLEX MICROSCOPIC    EKG None  Radiology Dg Chest 2 View  Result Date: 08/09/2018 CLINICAL DATA:  Cough, congestion EXAM: CHEST - 2 VIEW COMPARISON:  06/02/2018 FINDINGS: Heart and mediastinal contours are within normal limits. No focal opacities or effusions. No acute bony abnormality. IMPRESSION: No active cardiopulmonary disease. Electronically Signed   By: Charlett NoseKevin  Dover M.D.   On: 08/09/2018 01:15    Procedures Procedures (including critical care time)  Medications Ordered in ED Medications  albuterol (PROVENTIL) (2.5 MG/3ML) 0.083% nebulizer solution 5 mg (has no administration in time range)  ipratropium (ATROVENT) nebulizer solution 0.5 mg (has no administration in time range)     Initial Impression / Assessment and Plan / ED Course  I have reviewed the triage vital signs and the nursing notes.  Pertinent labs & imaging results that were available during my care of the patient were reviewed by me and considered in my medical decision making (see chart for details).     Patient with multiple complaints including URI symptoms with cough, subjective fever, congestion; as well as urinary frequency and concern for recent vaginal discharge with odor.   She is nontoxic in appearance. She has extensive wheezing throughout without reported history of asthma. Nebulizer treatment ordered. Vaginal exam shows BV, will treat with Metrogel. She also has evidence of UTI. Given symptoms of dysuria and >50 WBCs on cath speciment will treat. Will given  Levaquin which would cover any developing upper airway infection not evident on CXR tonight.   Wheezing completely resolved with nebulizer x 1. Patient is comfortable, coughing less, feels improved. Plan to discharge home with PCP referral.   Final Clinical Impressions(s) / ED Diagnoses   Final diagnoses:  None   1. Bronchitis 2. Bronchospasm 3. UTI 4. BV  ED Discharge Orders    None       Danne HarborUpstill, Karmello Abercrombie, PA-C 08/09/18 [id number]    Dione BoozeGlick, David, MD 08/09/18 [phone]

## 2018-08-09 NOTE — ED Notes (Signed)
Patient ambulated to bathroom without assistance. Steady gait.

## 2018-08-09 NOTE — ED Triage Notes (Signed)
Pt reports cold sx that started two weeks ago. Pt reports cough with yellow sputum and blood streaks, congestion and intermittent sob when coughing. Pt reports feeling hot last night and taking Tylenol. Pt denies pain.

## 2018-08-09 NOTE — ED Notes (Signed)
Pt also wants to be checked for an STD. Pt reports burning with urination and yellow vaginal discharge. Denies vaginal bleeding.

## 2018-08-09 NOTE — Discharge Instructions (Signed)
Use the inhaler every 4 hours for any chest tightness or excessive cough. Take Levaquin daily which will treat your urinary infection and if any bacterial respiratory infection is developing. Take Tylenol for any aches or if you feel you are running a fever. Push fluids. Use Metrogel as directed to treat your vaginal infection.

## 2018-08-09 NOTE — ED Notes (Signed)
Patient verbalizes understanding of medications and discharge instructions. No further questions at this time. VSS and patient ambulatory at discharge.   

## 2018-08-10 ENCOUNTER — Other Ambulatory Visit: Payer: Self-pay

## 2018-08-10 ENCOUNTER — Encounter (HOSPITAL_BASED_OUTPATIENT_CLINIC_OR_DEPARTMENT_OTHER): Payer: Self-pay

## 2018-08-10 ENCOUNTER — Inpatient Hospital Stay (HOSPITAL_BASED_OUTPATIENT_CLINIC_OR_DEPARTMENT_OTHER)
Admission: EM | Admit: 2018-08-10 | Discharge: 2018-08-12 | DRG: 871 | Disposition: A | Discharge status: Home / Self Care | Payer: Medicare Other | Attending: Family Medicine | Admitting: Family Medicine

## 2018-08-10 ENCOUNTER — Emergency Department (HOSPITAL_BASED_OUTPATIENT_CLINIC_OR_DEPARTMENT_OTHER): Payer: Medicare Other

## 2018-08-10 DIAGNOSIS — Z7951 Long term (current) use of inhaled steroids: Secondary | ICD-10-CM | POA: Diagnosis not present

## 2018-08-10 DIAGNOSIS — K529 Noninfective gastroenteritis and colitis, unspecified: Secondary | ICD-10-CM | POA: Diagnosis present

## 2018-08-10 DIAGNOSIS — K754 Autoimmune hepatitis: Secondary | ICD-10-CM | POA: Diagnosis present

## 2018-08-10 DIAGNOSIS — I959 Hypotension, unspecified: Secondary | ICD-10-CM | POA: Diagnosis present

## 2018-08-10 DIAGNOSIS — N179 Acute kidney failure, unspecified: Secondary | ICD-10-CM | POA: Diagnosis present

## 2018-08-10 DIAGNOSIS — D696 Thrombocytopenia, unspecified: Secondary | ICD-10-CM | POA: Diagnosis present

## 2018-08-10 DIAGNOSIS — E871 Hypo-osmolality and hyponatremia: Secondary | ICD-10-CM | POA: Diagnosis present

## 2018-08-10 DIAGNOSIS — N760 Acute vaginitis: Secondary | ICD-10-CM | POA: Diagnosis not present

## 2018-08-10 DIAGNOSIS — A419 Sepsis, unspecified organism: Principal | ICD-10-CM | POA: Diagnosis present

## 2018-08-10 DIAGNOSIS — K7460 Unspecified cirrhosis of liver: Secondary | ICD-10-CM | POA: Diagnosis present

## 2018-08-10 DIAGNOSIS — E860 Dehydration: Secondary | ICD-10-CM | POA: Diagnosis present

## 2018-08-10 DIAGNOSIS — Z79899 Other long term (current) drug therapy: Secondary | ICD-10-CM | POA: Diagnosis not present

## 2018-08-10 DIAGNOSIS — D72829 Elevated white blood cell count, unspecified: Secondary | ICD-10-CM | POA: Diagnosis present

## 2018-08-10 DIAGNOSIS — J181 Lobar pneumonia, unspecified organism: Secondary | ICD-10-CM | POA: Diagnosis present

## 2018-08-10 DIAGNOSIS — I10 Essential (primary) hypertension: Secondary | ICD-10-CM | POA: Diagnosis present

## 2018-08-10 DIAGNOSIS — J189 Pneumonia, unspecified organism: Secondary | ICD-10-CM | POA: Diagnosis present

## 2018-08-10 LAB — COMPREHENSIVE METABOLIC PANEL
ALBUMIN: 3 g/dL — AB (ref 3.5–5.0)
ALT: 86 U/L — ABNORMAL HIGH (ref 0–44)
ANION GAP: 12 (ref 5–15)
AST: 155 U/L — ABNORMAL HIGH (ref 15–41)
Alkaline Phosphatase: 328 U/L — ABNORMAL HIGH (ref 38–126)
BUN: 11 mg/dL (ref 6–20)
CHLORIDE: 95 mmol/L — AB (ref 98–111)
CO2: 20 mmol/L — ABNORMAL LOW (ref 22–32)
Calcium: 8.1 mg/dL — ABNORMAL LOW (ref 8.9–10.3)
Creatinine, Ser: 1.26 mg/dL — ABNORMAL HIGH (ref 0.44–1.00)
GFR calc Af Amer: 57 mL/min — ABNORMAL LOW (ref >60)
GFR calc non Af Amer: 49 mL/min — ABNORMAL LOW (ref >60)
GLUCOSE: 299 mg/dL — AB (ref 70–99)
POTASSIUM: 3.4 mmol/L — AB (ref 3.5–5.1)
SODIUM: 127 mmol/L — AB (ref 135–145)
TOTAL PROTEIN: 8.9 g/dL — AB (ref 6.5–8.1)
Total Bilirubin: 2.6 mg/dL — ABNORMAL HIGH (ref 0.3–1.2)

## 2018-08-10 LAB — CBC WITH DIFFERENTIAL/PLATELET
BASOS ABS: 0 10*3/uL (ref 0.0–0.1)
Basophils Relative: 0 %
EOS ABS: 0 10*3/uL (ref 0.0–0.7)
Eosinophils Relative: 0 %
HEMATOCRIT: 34.9 % — AB (ref 36.0–46.0)
Hemoglobin: 12.2 g/dL (ref 12.0–15.0)
LYMPHS ABS: 1.5 10*3/uL (ref 0.7–4.0)
Lymphocytes Relative: 9 %
MCH: 29.7 pg (ref 26.0–34.0)
MCHC: 35 g/dL (ref 30.0–36.0)
MCV: 84.9 fL (ref 78.0–100.0)
MONO ABS: 1.4 10*3/uL — AB (ref 0.1–1.0)
Monocytes Relative: 8 %
NEUTROS ABS: 14.1 10*3/uL — AB (ref 1.7–7.7)
Neutrophils Relative %: 83 %
PLATELETS: 120 10*3/uL — AB (ref 150–400)
RBC: 4.11 MIL/uL (ref 3.87–5.11)
RDW: 13.8 % (ref 11.5–15.5)
WBC: 17 10*3/uL — AB (ref 4.0–10.5)

## 2018-08-10 LAB — I-STAT CG4 LACTIC ACID, ED
LACTIC ACID, VENOUS: 2.52 mmol/L — AB (ref 0.5–1.9)
Lactic Acid, Venous: 4.79 mmol/L (ref 0.5–1.9)

## 2018-08-10 LAB — CREATININE, SERUM
CREATININE: 1.1 mg/dL — AB (ref 0.44–1.00)
GFR, EST NON AFRICAN AMERICAN: 58 mL/min — AB (ref >60)

## 2018-08-10 LAB — STREP PNEUMONIAE URINARY ANTIGEN: Strep Pneumo Urinary Antigen: NEGATIVE

## 2018-08-10 LAB — LIPASE, BLOOD: LIPASE: 25 U/L (ref 11–51)

## 2018-08-10 LAB — URINE CULTURE: CULTURE: NO GROWTH

## 2018-08-10 MED ORDER — PIPERACILLIN-TAZOBACTAM 3.375 G IVPB 30 MIN
3.3750 g | Freq: Once | INTRAVENOUS | Status: AC
  Administered 2018-08-10: 3.375 g via INTRAVENOUS
  Filled 2018-08-10 (×2): qty 50

## 2018-08-10 MED ORDER — IOPAMIDOL (ISOVUE-300) INJECTION 61%
100.0000 mL | Freq: Once | INTRAVENOUS | Status: AC | PRN
  Administered 2018-08-10: 100 mL via INTRAVENOUS

## 2018-08-10 MED ORDER — PIPERACILLIN-TAZOBACTAM 3.375 G IVPB: 3.3750 g | Freq: Three times a day (TID) | INTRAVENOUS | Status: DC

## 2018-08-10 MED ORDER — SODIUM CHLORIDE 0.9 % IV SOLN
1.0000 g | INTRAVENOUS | Status: DC
  Administered 2018-08-10: 1 g via INTRAVENOUS
  Filled 2018-08-10: qty 1

## 2018-08-10 MED ORDER — SODIUM CHLORIDE 0.9 % IV SOLN
500.0000 mg | INTRAVENOUS | Status: DC
  Administered 2018-08-10: 500 mg via INTRAVENOUS
  Filled 2018-08-10: qty 500

## 2018-08-10 MED ORDER — VANCOMYCIN HCL IN DEXTROSE 750-5 MG/150ML-% IV SOLN
750.0000 mg | Freq: Two times a day (BID) | INTRAVENOUS | Status: DC
  Filled 2018-08-10: qty 150

## 2018-08-10 MED ORDER — SODIUM CHLORIDE 0.9 % IV BOLUS
2000.0000 mL | Freq: Once | INTRAVENOUS | Status: AC
  Administered 2018-08-10: 2000 mL via INTRAVENOUS

## 2018-08-10 MED ORDER — ACETAMINOPHEN 500 MG PO TABS
1000.0000 mg | ORAL_TABLET | Freq: Once | ORAL | Status: AC
  Administered 2018-08-10: 1000 mg via ORAL
  Filled 2018-08-10: qty 2

## 2018-08-10 MED ORDER — VANCOMYCIN HCL 10 G IV SOLR
2000.0000 mg | Freq: Once | INTRAVENOUS | Status: DC
  Filled 2018-08-10: qty 2000

## 2018-08-10 MED ORDER — VANCOMYCIN HCL IN DEXTROSE 1-5 GM/200ML-% IV SOLN
INTRAVENOUS | Status: AC
  Administered 2018-08-10: 2000 mg
  Filled 2018-08-10: qty 400

## 2018-08-10 MED ORDER — SODIUM CHLORIDE 0.9 % IV SOLN
INTRAVENOUS | Status: DC
  Administered 2018-08-11 – 2018-08-12 (×2): via INTRAVENOUS

## 2018-08-10 MED ORDER — SODIUM CHLORIDE 0.9 % IV BOLUS
1000.0000 mL | Freq: Once | INTRAVENOUS | Status: AC
  Administered 2018-08-10: 1000 mL via INTRAVENOUS

## 2018-08-10 MED ORDER — ONDANSETRON HCL 4 MG/2ML IJ SOLN
4.0000 mg | Freq: Once | INTRAMUSCULAR | Status: AC
  Administered 2018-08-10: 4 mg via INTRAVENOUS
  Filled 2018-08-10: qty 2

## 2018-08-10 MED ORDER — SODIUM CHLORIDE 0.9 % IV SOLN
INTRAVENOUS | Status: DC | PRN
  Administered 2018-08-10: 250 mL via INTRAVENOUS

## 2018-08-10 MED ORDER — HEPARIN SODIUM (PORCINE) 5000 UNIT/ML IJ SOLN
5000.0000 [IU] | Freq: Three times a day (TID) | INTRAMUSCULAR | Status: DC
  Administered 2018-08-10 – 2018-08-12 (×5): 5000 [IU] via SUBCUTANEOUS
  Filled 2018-08-10 (×5): qty 1

## 2018-08-10 MED ORDER — MORPHINE SULFATE (PF) 4 MG/ML IV SOLN
4.0000 mg | Freq: Once | INTRAVENOUS | Status: AC
  Administered 2018-08-10: 4 mg via INTRAVENOUS
  Filled 2018-08-10: qty 1

## 2018-08-10 MED ORDER — METRONIDAZOLE IN NACL 5-0.79 MG/ML-% IV SOLN
500.0000 mg | Freq: Three times a day (TID) | INTRAVENOUS | Status: DC
  Administered 2018-08-10 – 2018-08-11 (×2): 500 mg via INTRAVENOUS
  Filled 2018-08-10 (×2): qty 100

## 2018-08-10 NOTE — ED Notes (Signed)
Report to Erica, RN at [facility] 

## 2018-08-10 NOTE — ED Triage Notes (Signed)
C/o abd pain, n/v/d x 2 weeks-to triage in w/c

## 2018-08-10 NOTE — ED Notes (Signed)
Patient transported to CT 

## 2018-08-10 NOTE — Progress Notes (Signed)
[facility] Plan of Care Note  Patient: Heather Bass    XTG:[id number]  PCP: Abel[facility]elPremier, [facility]    DOB: 23-May-1971  DOS: 08/10/2018   [facility] [name].  Plan of care: The patient will be accepted for admission to hospital, telemetry unit. 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   Author: Lynden OxfordPranav Ardelle Haliburton, MD [facility] Pager: [phone] 08/10/2018 6:24 PM   If 7PM-7AM, please contact night-coverage at [email or web address], password [email or web address]

## 2018-08-10 NOTE — Progress Notes (Signed)
Pharmacy Antibiotic Note  Lucila MaineDelma Maisie Fushomas is a 47 y.o. female admitted on 08/10/2018 with sepsis.  Pharmacy has been consulted for vancomycin and zosyn dosing.  Presents with abdominal pain, Tmax 103, tachycardic, and LA 4.79.  SCr 1.26, normalized CrCl ~ 72 mL/min.  Plan: Vancomycin 2000mg  IV x 1, then 750mg  IV q 12 hours Zosyn 3.375g IV every 8 hours (4 hour infusion) Monitor renal function, clinical progression, vancomycin level at steady state  Height: 5\' 11"  (180.3 cm) Weight: 235 lb (106.6 kg) IBW/kg (Calculated) : 70.8  Temp (24hrs), Avg:100.9 F (38.3 C), Min:98.6 F (37 C), Max:103.1 F (39.5 C)  Recent Labs  Lab 08/10/18 1325 08/10/18 1401  CREATININE 1.26*  --   LATICACIDVEN  --  4.79*    Estimated Creatinine Clearance: 72.6 mL/min (A) (by C-G formula based on SCr of 1.26 mg/dL (H)).    No Known Allergies  Antimicrobials this admission: Vanc 8/28>> Zosyn 8/28>>  Dose adjustments this admission: n/a  Microbiology results: 8/28 BCx: sent   Daylene PoseyJonathan Britton Perkinson, PharmD Clinical Pharmacist Please check AMION for all [facility] numbers 08/10/2018 2:24 PM

## 2018-08-10 NOTE — ED Provider Notes (Signed)
[facility] EMERGENCY DEPARTMENT Provider Note   CSN: [id number] Arrival date & time: 08/10/18  1307     History   Chief Complaint Chief Complaint  Patient presents with  . Abdominal Pain    HPI Heather Bass is a 47 y.o. female.  47 yo F with a chief complaint of right lower quadrant abdominal pain.  This been going on for the past couple days.  Having fevers and chills at home as well as nausea vomiting diarrhea.  Patient denies any sick contacts.  Denies any chest pain or shortness of breath.  Denies any dysuria increased frequency or hesitancy.  Denies flank pain.  Old records were reviewed and the patient was seen yesterday for URI-like symptoms as well as vaginal discharge.  She seems somewhat confused about that visit, states that she was having abdominal pain during that but it was not evaluated.  The history is provided by the patient.  Abdominal Pain   This is a new problem. The current episode started 2 days ago. The problem occurs constantly. The problem has been gradually worsening. The pain is located in the RLQ. The quality of the pain is sharp and shooting. The pain is at a severity of 10/10. The pain is moderate. Associated symptoms include fever, diarrhea, nausea and vomiting. Pertinent negatives include dysuria, headaches, arthralgias and myalgias. Nothing aggravates the symptoms. Nothing relieves the symptoms.    Past Medical History:  Diagnosis Date  . Liver disorder     There are no active problems to display for this patient.   Past Surgical History:  Procedure Laterality Date  . ABDOMINAL HYSTERECTOMY    . CHOLECYSTECTOMY       OB History   None      Home Medications    Prior to Admission medications   Medication Sig Start Date End Date Taking? Authorizing Provider  albuterol (PROVENTIL HFA;VENTOLIN HFA) 108 (90 Base) MCG/ACT inhaler Inhale 1-2 puffs into the lungs every 6 (six) hours as needed for wheezing or shortness of breath  (cough). 08/09/18   Elpidio Anis, PA-C  cephALEXin (KEFLEX) 500 MG capsule Take 1 capsule (500 mg total) by mouth 4 (four) times daily. Patient not taking: Reported on 06/02/2018 07/26/17   Graciella Freer A, PA-C  levofloxacin (LEVAQUIN) 500 MG tablet Take 1 tablet (500 mg total) by mouth daily. 08/09/18   Elpidio Anis, PA-C  metroNIDAZOLE (FLAGYL) 500 MG tablet Take 1 tablet (500 mg total) by mouth 2 (two) times daily. Patient not taking: Reported on 06/02/2018 07/26/17   Graciella Freer A, PA-C  metroNIDAZOLE (METROGEL VAGINAL) 0.75 % vaginal gel Place 1 Applicatorful vaginally 2 (two) times daily. 08/09/18   Elpidio Anis, PA-C  ondansetron (ZOFRAN ODT) 4 MG disintegrating tablet Take 1 tablet (4 mg total) by mouth every 4 (four) hours as needed for nausea or vomiting. Patient not taking: Reported on 08/09/2018 02/06/18   Arby Barrette, MD  pantoprazole (PROTONIX) 20 MG tablet Take 1 tablet (20 mg total) by mouth daily. Patient not taking: Reported on 06/02/2018 02/06/18   Arby Barrette, MD    Family History No family history on file.  Social History Social History   Tobacco Use  . Smoking status: Never Smoker  . Smokeless tobacco: Never Used  Substance Use Topics  . Alcohol use: Not Currently  . Drug use: No     Allergies   Patient has no known allergies.   Review of Systems Review of Systems  Constitutional: Positive for chills and fever.  HENT: Negative for congestion and rhinorrhea.   Eyes: Negative for redness and visual disturbance.  Respiratory: Negative for shortness of breath and wheezing.   Cardiovascular: Negative for chest pain and palpitations.  Gastrointestinal: Positive for abdominal pain, diarrhea, nausea and vomiting.  Genitourinary: Negative for dysuria and urgency.  Musculoskeletal: Negative for arthralgias and myalgias.  Skin: Negative for pallor and wound.  Neurological: Negative for dizziness and headaches.     Physical Exam Updated Vital  Signs BP 94/61   Pulse (!) 125   Temp (!) 103.1 F (39.5 C) (Oral)   Resp (!) 24   Ht 5\' 11"  (1.803 m)   Wt 106.6 kg   SpO2 95%   BMI 32.78 kg/m   Physical Exam  Constitutional: She is oriented to person, place, and time. She appears well-developed and well-nourished. No distress.  HENT:  Head: Normocephalic and atraumatic.  Eyes: Pupils are equal, round, and reactive to light. EOM are normal.  Neck: Normal range of motion. Neck supple.  Cardiovascular: Regular rhythm. Tachycardia present. Exam reveals no gallop and no friction rub.  No murmur heard. Pulmonary/Chest: Effort normal. She has no wheezes. She has no rales.  Abdominal: Soft. She exhibits no distension and no mass. There is no tenderness. There is no guarding.  Musculoskeletal: She exhibits no edema or tenderness.  Neurological: She is alert and oriented to person, place, and time.  Skin: Skin is warm and dry. She is not diaphoretic.  Psychiatric: She has a normal mood and affect. Her behavior is normal.  Nursing note and vitals reviewed.    ED Treatments / Results  Labs (all labs ordered are listed, but only abnormal results are displayed) Labs Reviewed  CBC WITH DIFFERENTIAL/PLATELET - Abnormal; Notable for the following components:      Result Value   WBC 17.0 (*)    HCT 34.9 (*)    Platelets 120 (*)    Neutro Abs 14.1 (*)    Monocytes Absolute 1.4 (*)    All other components within normal limits  COMPREHENSIVE METABOLIC PANEL - Abnormal; Notable for the following components:   Sodium 127 (*)    Potassium 3.4 (*)    Chloride 95 (*)    CO2 20 (*)    Glucose, Bld 299 (*)    Creatinine, Ser 1.26 (*)    Calcium 8.1 (*)    Total Protein 8.9 (*)    Albumin 3.0 (*)    AST 155 (*)    ALT 86 (*)    Alkaline Phosphatase 328 (*)    Total Bilirubin 2.6 (*)    GFR calc non Af Amer 49 (*)    GFR calc Af Amer 57 (*)    All other components within normal limits  I-STAT CG4 LACTIC ACID, ED - Abnormal; Notable  for the following components:   Lactic Acid, Venous 4.79 (*)    All other components within normal limits  CULTURE, BLOOD (ROUTINE X 2)  CULTURE, BLOOD (ROUTINE X 2)  LIPASE, BLOOD  URINALYSIS, ROUTINE W REFLEX MICROSCOPIC    EKG EKG Interpretation  Date/Time:  Wednesday August 10 2018 13:22:26 EDT Ventricular Rate:  148 PR Interval:    QRS Duration: 67 QT Interval:  285 QTC Calculation: 448 R Axis:   71 Text Interpretation:  Sinus tachycardia Probable left atrial enlargement Borderline T abnormalities, inferior leads st changes likely rate related Otherwise no significant change Confirmed by Melene Plan [phone]) on 08/10/2018 1:59:31 PM   Radiology Dg Chest 2 View  Result Date: 08/09/2018 CLINICAL DATA:  Cough, congestion EXAM: CHEST - 2 VIEW COMPARISON:  06/02/2018 FINDINGS: Heart and mediastinal contours are within normal limits. No focal opacities or effusions. No acute bony abnormality. IMPRESSION: No active cardiopulmonary disease. Electronically Signed   By: Charlett Nose M.D.   On: 08/09/2018 01:15   Ct Abdomen Pelvis W Contrast  Result Date: 08/10/2018 CLINICAL DATA:  RIGHT lower quadrant pain, nausea, vomiting and diarrhea for 2 weeks. History of cirrhosis, hysterectomy and cholecystectomy. EXAM: CT ABDOMEN AND PELVIS WITH CONTRAST TECHNIQUE: Multidetector CT imaging of the abdomen and pelvis was performed using the standard protocol following bolus administration of intravenous contrast. CONTRAST:  ISOVUE-300 IOPAMIDOL (ISOVUE-300) INJECTION 61% COMPARISON:  Chest radiograph August 09, 2018. CT chest report dated February 26, 2016 though images are not available for direct comparison. FINDINGS: LOWER CHEST: Dense consolidation RIGHT lower lobe with air bronchograms. No pleural effusion. HEPATOBILIARY: Nodular cirrhotic liver with mild LEFT lobe biliary dilatation. 2 cm hypodensity RIGHT lobe of the liver segment 5 with surrounding density and status post cholecystectomy.  PANCREAS: Normal. SPLEEN: Mild splenomegaly, punctate calcified granuloma. ADRENALS/URINARY TRACT: Kidneys are orthotopic, demonstrating symmetric enhancement. No nephrolithiasis, hydronephrosis or solid renal masses. The unopacified ureters are normal in course and caliber. Delayed imaging through the kidneys demonstrates symmetric prompt contrast excretion within the proximal urinary collecting system. Urinary bladder is partially distended and unremarkable. Normal adrenal glands. STOMACH/BOWEL: Small hiatal hernia. Fluid filled small bowel. Colonic air-fluid levels. Bowel is normal in course and caliber. VASCULAR/LYMPHATIC: Aortoiliac vessels are normal in course and caliber. Small periumbilical varices. 17 mm cardio phrenic angle lymph node. Mild portal caval in porta hepatis lymphadenopathy is likely reactive. REPRODUCTIVE: Status post hysterectomy. OTHER: No intraperitoneal free fluid or free air. MUSCULOSKELETAL: Nonacute. Small fat containing umbilical hernia. Moderate L5-S1 degenerative disc. IMPRESSION: 1. RIGHT lower lobe pneumonia. Mild lymphadenopathy may be reactive though recommend close attention on follow-up imaging. 2. Small and large bowel air-fluid levels most compatible seen with enteritis. Normal appendix. 3. Cirrhosis and portal hypertension, no ascites. Nodular densities RIGHT lobe of the liver previously attributed to fibrosis and transient hepatic attenuation though images are not available for comparison. When submitted, an addendum will be made to this report, recommendation may alter after comparison with prior. Electronically Signed   By: Awilda Metro M.D.   On: 08/10/2018 14:57    Procedures Procedures (including critical care time)  Medications Ordered in ED Medications  vancomycin (VANCOCIN) 2,000 mg in sodium chloride 0.9 % 500 mL IVPB (has no administration in time range)  0.9 %  sodium chloride infusion (250 mLs Intravenous New Bag/Given 08/10/18 1433)  0.9 %  sodium  chloride infusion (250 mLs Intravenous New Bag/Given 08/10/18 1438)  sodium chloride 0.9 % bolus 2,000 mL (has no administration in time range)  sodium chloride 0.9 % bolus 1,000 mL ( Intravenous Stopped 08/10/18 1510)  ondansetron (ZOFRAN) injection 4 mg (4 mg Intravenous Given 08/10/18 1338)  morphine 4 MG/ML injection 4 mg (4 mg Intravenous Given 08/10/18 1352)  acetaminophen (TYLENOL) tablet 1,000 mg (1,000 mg Oral Given 08/10/18 1409)  piperacillin-tazobactam (ZOSYN) IVPB 3.375 g (3.375 g Intravenous New Bag/Given 08/10/18 1435)  iopamidol (ISOVUE-300) 61 % injection 100 mL (100 mLs Intravenous Contrast Given 08/10/18 1417)  vancomycin (VANCOCIN) 1-5 GM/200ML-% IVPB (1,000 mg  New Bag/Given 08/10/18 1439)     Initial Impression / Assessment and Plan / ED Course  I have reviewed the triage vital signs and the nursing notes.  Pertinent labs &  imaging results that were available during my care of the patient were reviewed by me and considered in my medical decision making (see chart for details).     47 yo F with a chief complaint of right lower quadrant abdominal pain and fever.  Patient has a temperature of 103 here on arrival.  Tachycardic into the 150s.  She has no abdominal tenderness on my exam but with that I have a fever and symptoms of right lower quadrant pain will obtain a CT scan.  Patient's lactate was significantly elevated.  She tells me that she has a history of cirrhosis, old record reivewed seen at [facility] in 7/18.    Patient has elevated LFTs and total bili here, seems consistent with some of her previous laboratory evaluations.  CT scan has right lower lobe pneumonia.  Patient was started on broad-spectrum antibiotics with her lactate level.  Given 3 L of IV fluids.  Has had improvement of her heart rate from the 150s into the 110 range.  She is feeling better on reassessment.  Lactate may have been elevated due to the patient's renal dysfunction.  Will discuss with the  hospitalist for admission.  CRITICAL CARE Performed by: Rae Roamaniel Patrick Jaydeen Odor   Total critical care time: 80 minutes  Critical care time was exclusive of separately billable procedures and treating other patients.  Critical care was necessary to treat or prevent imminent or life-threatening deterioration.  Critical care was time spent personally by me on the following activities: development of treatment plan with patient and/or surrogate as well as nursing, discussions with consultants, evaluation of patient's response to treatment, examination of patient, obtaining history from patient or surrogate, ordering and performing treatments and interventions, ordering and review of laboratory studies, ordering and review of radiographic studies, pulse oximetry and re-evaluation of patient's condition.  The patients results and plan were reviewed and discussed.   Any x-rays performed were independently reviewed by myself.   Differential diagnosis were considered with the presenting HPI.  The patients results and plan were reviewed and discussed.   Any x-rays performed were independently reviewed by myself.   Differential diagnosis were considered with the presenting HPI.  Medications  vancomycin (VANCOCIN) 2,000 mg in sodium chloride 0.9 % 500 mL IVPB (has no administration in time range)  0.9 %  sodium chloride infusion (250 mLs Intravenous New Bag/Given 08/10/18 1433)  0.9 %  sodium chloride infusion (250 mLs Intravenous New Bag/Given 08/10/18 1438)  sodium chloride 0.9 % bolus 2,000 mL (2,000 mLs Intravenous New Bag/Given 08/10/18 1517)  piperacillin-tazobactam (ZOSYN) IVPB 3.375 g (has no administration in time range)  vancomycin (VANCOCIN) IVPB 750 mg/150 ml premix (has no administration in time range)  sodium chloride 0.9 % bolus 1,000 mL ( Intravenous Stopped 08/10/18 1510)  ondansetron (ZOFRAN) injection 4 mg (4 mg Intravenous Given 08/10/18 1338)  morphine 4 MG/ML injection 4 mg (4 mg  Intravenous Given 08/10/18 1352)  acetaminophen (TYLENOL) tablet 1,000 mg (1,000 mg Oral Given 08/10/18 1409)  piperacillin-tazobactam (ZOSYN) IVPB 3.375 g ( Intravenous Stopped 08/10/18 1505)  iopamidol (ISOVUE-300) 61 % injection 100 mL (100 mLs Intravenous Contrast Given 08/10/18 1417)  vancomycin (VANCOCIN) 1-5 GM/200ML-% IVPB (1,000 mg  New Bag/Given 08/10/18 1439)    Vitals:   08/10/18 1325 08/10/18 1400 08/10/18 1430 08/10/18 1500  BP:  106/68 117/76 94/61  Pulse:  (!) 129 (!) 127 (!) 125  Resp:  (!) 31 (!) 27 (!) 24  Temp: (!) 103.1 F (39.5 C)  TempSrc: Oral     SpO2:  94% 97% 95%  Weight:      Height:        Final diagnoses:  Community acquired pneumonia of right lower lobe of lung (HCC)    Admission/ observation were discussed with the admitting physician, patient and/or family and they are comfortable with the plan.    Medications  vancomycin (VANCOCIN) 2,000 mg in sodium chloride 0.9 % 500 mL IVPB (has no administration in time range)  0.9 %  sodium chloride infusion (250 mLs Intravenous New Bag/Given 08/10/18 1433)  0.9 %  sodium chloride infusion (250 mLs Intravenous New Bag/Given 08/10/18 1438)  sodium chloride 0.9 % bolus 2,000 mL (has no administration in time range)  sodium chloride 0.9 % bolus 1,000 mL ( Intravenous Stopped 08/10/18 1510)  ondansetron (ZOFRAN) injection 4 mg (4 mg Intravenous Given 08/10/18 1338)  morphine 4 MG/ML injection 4 mg (4 mg Intravenous Given 08/10/18 1352)  acetaminophen (TYLENOL) tablet 1,000 mg (1,000 mg Oral Given 08/10/18 1409)  piperacillin-tazobactam (ZOSYN) IVPB 3.375 g (3.375 g Intravenous New Bag/Given 08/10/18 1435)  iopamidol (ISOVUE-300) 61 % injection 100 mL (100 mLs Intravenous Contrast Given 08/10/18 1417)  vancomycin (VANCOCIN) 1-5 GM/200ML-% IVPB (1,000 mg  New Bag/Given 08/10/18 1439)    Vitals:   08/10/18 1325 08/10/18 1400 08/10/18 1430 08/10/18 1500  BP:  106/68 117/76 94/61  Pulse:  (!) 129 (!) 127 (!) 125  Resp:   (!) 31 (!) 27 (!) 24  Temp: (!) 103.1 F (39.5 C)     TempSrc: Oral     SpO2:  94% 97% 95%  Weight:      Height:        Final diagnoses:  Community acquired pneumonia of right lower lobe of lung (HCC)    Admission/ observation were discussed with the admitting physician, patient and/or family and they are comfortable with the plan.   Final Clinical Impressions(s) / ED Diagnoses   Final diagnoses:  Community acquired pneumonia of right lower lobe of lung [facility])    ED Discharge Orders    None       Melene Plan, DO 08/10/18 1535

## 2018-08-10 NOTE — H&P (Signed)
History and Physical    Heather Bass FAO:[id number] DOB: 1971-05-21 DOA: 08/10/2018  Referring MD/NP/PA: Dr Melene Planan Floyd   PCP: [facility]   Patient [facility]: [facility]  Chief Complaint: Abdominal pain  HPI: Heather Bass is a 47 y.o. female with medical history significant of liver cirrhosis without ascites, diabetes, hypertension not on any specific medications who [facility] [place] with right upper quadrant abdominal pain nausea as well as fever.  She has had nausea with vomiting as well as diarrhea.  She has had fever and chills at home in the last 3 days.  Denied any sick contacts.  Patient has noted shortness of breath as well as cough.  She is also noted persistent diarrhea with abdominal distention.  No hematochezia, no melena no bright red blood per rectum.  Patient was evaluated and found to have sepsis syndrome with CT scan showing pneumonia as well as possible enteritis.  Patient is being transferred to inpatient service therefore for treatment.  He still has cough with some mild sputum.  Abdominal pain has gotten a little better but still persist.  [facility] with complaint of upper respiratory tract infection and possible STDs.  She had dysuria at the time with congestion and cough.  Patient was treated symptomatically and discharged home with a diagnosis of bronchitis UTI and bacterial vaginosis.  She is back in the ER today with same complaint.  Now she is found to have pneumonia..    ED Course: Temperature was 103.1, blood pressure 90/62, pulse 160, respiratory rate of 35, oxygen sats 94% on room air.  She has a white count of 17,000 hemoglobin 12.2 platelets 120 sodium 127 potassium 3.4 chloride 95 CO2 20 BUN is 11 and creatinine 1.26.  Glucose is 299 and initial lactic acid of 4.79.  LFTs are elevated consistent with the cirrhosis.  CT abdomen and pelvis showed right lower lobe pneumonia  and findings consistent with enteritis.  Review of Systems: As per HPI otherwise 10 point review of systems negative.    Past Medical History:  Diagnosis Date  . Liver disorder     Past Surgical History:  Procedure Laterality Date  . ABDOMINAL HYSTERECTOMY    . CHOLECYSTECTOMY       reports that she has never smoked. She has never used smokeless tobacco. She reports that she drank alcohol. She reports that she does not use drugs.  No Known Allergies  No family history on file.   Prior to Admission medications   Medication Sig Start Date End Date Taking? Authorizing Provider  albuterol (PROVENTIL HFA;VENTOLIN HFA) 108 (90 Base) MCG/ACT inhaler Inhale 1-2 puffs into the lungs every 6 (six) hours as needed for wheezing or shortness of breath (cough). 08/09/18   Elpidio AnisUpstill, Shari, PA-C  cephALEXin (KEFLEX) 500 MG capsule Take 1 capsule (500 mg total) by mouth 4 (four) times daily. Patient not taking: Reported on 06/02/2018 07/26/17   Graciella FreerLayden, Lindsey A, PA-C  levofloxacin (LEVAQUIN) 500 MG tablet Take 1 tablet (500 mg total) by mouth daily. 08/09/18   Elpidio AnisUpstill, Shari, PA-C  metroNIDAZOLE (FLAGYL) 500 MG tablet Take 1 tablet (500 mg total) by mouth 2 (two) times daily. Patient not taking: Reported on 06/02/2018 07/26/17   Graciella FreerLayden, Lindsey A, PA-C  metroNIDAZOLE (METROGEL VAGINAL) 0.75 % vaginal gel Place 1 Applicatorful vaginally 2 (two) times daily. 08/09/18   Elpidio AnisUpstill, Shari, PA-C  ondansetron (ZOFRAN ODT) 4 MG disintegrating  tablet Take 1 tablet (4 mg total) by mouth every 4 (four) hours as needed for nausea or vomiting. Patient not taking: Reported on 08/09/2018 02/06/18   Arby Barrette, MD  pantoprazole (PROTONIX) 20 MG tablet Take 1 tablet (20 mg total) by mouth daily. Patient not taking: Reported on 06/02/2018 02/06/18   Arby Barrette, MD    Physical Exam: Vitals:   08/10/18 1602 08/10/18 1633 08/10/18 1740 08/10/18 1744  BP: 103/64 94/67  113/72  Pulse: (!) 118 (!) 118  (!) 123  Resp:  19 (!) 21  20  Temp:  99.5 F (37.5 C)  99.3 F (37.4 C)  TempSrc:  Oral  Oral  SpO2: 96% 97%  94%  Weight:   109.7 kg   Height:   5\' 11"  (1.803 m)       Constitutional: NAD, calm, comfortable Vitals:   08/10/18 1602 08/10/18 1633 08/10/18 1740 08/10/18 1744  BP: 103/64 94/67  113/72  Pulse: (!) 118 (!) 118  (!) 123  Resp: 19 (!) 21  20  Temp:  99.5 F (37.5 C)  99.3 F (37.4 C)  TempSrc:  Oral  Oral  SpO2: 96% 97%  94%  Weight:   109.7 kg   Height:   5\' 11"  (1.803 m)    Eyes: PERRL, lids and conjunctivae normal ENMT: Mucous membranes are moist. Posterior pharynx clear of any exudate or lesions.Normal dentition.  Neck: normal, supple, no masses, no thyromegaly Respiratory: Decreased air entry more on the right  no wheezing, no crackles. Normal respiratory effort. No accessory muscle use.  Cardiovascular: Regular rate and rhythm, no murmurs / rubs / gallops. No extremity edema. 2+ pedal pulses. No carotid bruits.  Abdomen: Distended, mild tenderness, no masses palpated. No hepatosplenomegaly. Bowel sounds positive.  Musculoskeletal: no clubbing / cyanosis. No joint deformity upper and lower extremities. Good ROM, no contractures. Normal muscle tone.  Skin: no rashes, lesions, ulcers. No induration Neurologic: CN 2-12 grossly intact. Sensation intact, DTR normal. Strength 5/5 in all 4.  Psychiatric: Normal judgment and insight. Alert and oriented x 3. Normal mood.   Labs on Admission: I have personally reviewed following labs and imaging studies  CBC: Recent Labs  Lab 08/10/18 1325  WBC 17.0*  NEUTROABS 14.1*  HGB 12.2  HCT 34.9*  MCV 84.9  PLT 120*   Basic Metabolic Panel: Recent Labs  Lab 08/10/18 1325  NA 127*  K 3.4*  CL 95*  CO2 20*  GLUCOSE 299*  BUN 11  CREATININE 1.26*  CALCIUM 8.1*   GFR: Estimated Creatinine Clearance: 73.7 mL/min (A) (by C-G formula based on SCr of 1.26 mg/dL (H)). Liver Function Tests: Recent Labs  Lab 08/10/18 1325  AST  155*  ALT 86*  ALKPHOS 328*  BILITOT 2.6*  PROT 8.9*  ALBUMIN 3.0*   Recent Labs  Lab 08/10/18 1325  LIPASE 25   No results for input(s): AMMONIA in the last 168 hours. Coagulation Profile: No results for input(s): INR, PROTIME in the last 168 hours. Cardiac Enzymes: No results for input(s): CKTOTAL, CKMB, CKMBINDEX, TROPONINI in the last 168 hours. BNP (last 3 results) No results for input(s): PROBNP in the last 8760 hours. HbA1C: No results for input(s): HGBA1C in the last 72 hours. CBG: No results for input(s): GLUCAP in the last 168 hours. Lipid Profile: No results for input(s): CHOL, HDL, LDLCALC, TRIG, CHOLHDL, LDLDIRECT in the last 72 hours. Thyroid Function Tests: No results for input(s): TSH, T4TOTAL, FREET4, T3FREE, THYROIDAB in the last 72  hours. Anemia Panel: No results for input(s): VITAMINB12, FOLATE, FERRITIN, TIBC, IRON, RETICCTPCT in the last 72 hours. Urine analysis:    Component Value Date/Time   COLORURINE YELLOW 08/09/2018 0452   APPEARANCEUR HAZY (A) 08/09/2018 0452   LABSPEC 1.011 08/09/2018 0452   PHURINE 6.0 08/09/2018 0452   GLUCOSEU NEGATIVE 08/09/2018 0452   HGBUR SMALL (A) 08/09/2018 0452   BILIRUBINUR NEGATIVE 08/09/2018 0452   KETONESUR NEGATIVE 08/09/2018 0452   PROTEINUR NEGATIVE 08/09/2018 0452   NITRITE NEGATIVE 08/09/2018 0452   LEUKOCYTESUR LARGE (A) 08/09/2018 0452   Sepsis Labs: @LABRCNTIP (procalcitonin:4,lacticidven:4) ) Recent Results (from the past 240 hour(s))  Urine culture     Status: None   Collection Time: 08/09/18  4:52 AM  Result Value Ref Range Status   Specimen Description URINE, CATHETERIZED  Final   Special Requests NONE  Final   Culture   Final    NO GROWTH Performed at [facility], [address], [place], [place] [address]    Report Status 08/10/2018 FINAL  Final  Wet prep, genital     Status: Abnormal   Collection Time: 08/09/18  4:53 AM  Result Value Ref Range Status   Yeast Wet Prep HPF POC  NONE SEEN NONE SEEN Final   Trich, Wet Prep NONE SEEN NONE SEEN Final   Clue Cells Wet Prep HPF POC PRESENT (A) NONE SEEN Final   WBC, Wet Prep HPF POC MODERATE (A) NONE SEEN Final   Sperm NONE SEEN  Final    Comment: Performed at [facility], [address] [place], [place] [address]     Radiological Exams on Admission: Dg Chest 2 View  Result Date: 08/09/2018 CLINICAL DATA:  Cough, congestion EXAM: CHEST - 2 VIEW COMPARISON:  06/02/2018 FINDINGS: Heart and mediastinal contours are within normal limits. No focal opacities or effusions. No acute bony abnormality. IMPRESSION: No active cardiopulmonary disease. Electronically Signed   By: Charlett Nose M.D.   On: 08/09/2018 01:15   Ct Abdomen Pelvis W Contrast  Result Date: 08/10/2018 CLINICAL DATA:  RIGHT lower quadrant pain, nausea, vomiting and diarrhea for 2 weeks. History of cirrhosis, hysterectomy and cholecystectomy. EXAM: CT ABDOMEN AND PELVIS WITH CONTRAST TECHNIQUE: Multidetector CT imaging of the abdomen and pelvis was performed using the standard protocol following bolus administration of intravenous contrast. CONTRAST:  ISOVUE-300 IOPAMIDOL (ISOVUE-300) INJECTION 61% COMPARISON:  Chest radiograph August 09, 2018. CT chest report dated February 26, 2016 though images are not available for direct comparison. FINDINGS: LOWER CHEST: Dense consolidation RIGHT lower lobe with air bronchograms. No pleural effusion. HEPATOBILIARY: Nodular cirrhotic liver with mild LEFT lobe biliary dilatation. 2 cm hypodensity RIGHT lobe of the liver segment 5 with surrounding density and status post cholecystectomy. PANCREAS: Normal. SPLEEN: Mild splenomegaly, punctate calcified granuloma. ADRENALS/URINARY TRACT: Kidneys are orthotopic, demonstrating symmetric enhancement. No nephrolithiasis, hydronephrosis or solid renal masses. The unopacified ureters are normal in course and caliber. Delayed imaging through the kidneys demonstrates symmetric prompt  contrast excretion within the proximal urinary collecting system. Urinary bladder is partially distended and unremarkable. Normal adrenal glands. STOMACH/BOWEL: Small hiatal hernia. Fluid filled small bowel. Colonic air-fluid levels. Bowel is normal in course and caliber. VASCULAR/LYMPHATIC: Aortoiliac vessels are normal in course and caliber. Small periumbilical varices. 17 mm cardio phrenic angle lymph node. Mild portal caval in porta hepatis lymphadenopathy is likely reactive. REPRODUCTIVE: Status post hysterectomy. OTHER: No intraperitoneal free fluid or free air. MUSCULOSKELETAL: Nonacute. Small fat containing umbilical hernia. Moderate L5-S1 degenerative disc.  IMPRESSION: 1. RIGHT lower lobe pneumonia. Mild lymphadenopathy may be reactive though recommend close attention on follow-up imaging. 2. Small and large bowel air-fluid levels most compatible seen with enteritis. Normal appendix. 3. Cirrhosis and portal hypertension, no ascites. Nodular densities RIGHT lobe of the liver previously attributed to fibrosis and transient hepatic attenuation though images are not available for comparison. When submitted, an addendum will be made to this report, recommendation may alter after comparison with prior. Electronically Signed   By: Awilda Metro M.D.   On: 08/10/2018 14:57   Assessment/Plan Principal Problem:   Sepsis (HCC) Active Problems:   Pneumonia   Hepatic cirrhosis (HCC)   Hyponatremia   ARF (acute renal failure) (HCC)   Leucocytosis   Enteritis   #1 sepsis syndrome: Most likely secondary to pneumonia and gastroenteritis.  Patient will be admitted to monitored bed.  Lactic acid is already improving.  We will treat underlying pneumonia as well as hydrate aggressively.  Follow blood cultures as well as sputum culture results.  Adjust antibiotics as needed.  #2 community-acquired pneumonia: We will initiate Rocephin and Zithromax.  Patient will be monitored closely for possible need to  switch antibiotics.  If no improvement we will use more broad-spectrum antibiotics.  Follow sputum cultures as well as blood cultures.  #3 hyponatremia: Most likely due to dehydration and prerenal's syndrome.  Also cirrhosis noted on her CT scan which could be responsible.  Hydrate patient and monitor sodium level.  #4 acute kidney injury: Most likely prerenal causes.  Continue hydration and monitor renal function.  #5 enteritis: Patient has symptoms of acute gastritis noted on CT also.  In addition to her Rocephin I will continue Flagyl as needed.  #6 bacterial vaginosis: This was diagnosed last night.  She was on metronidazole.  We will continue with IV metronidazole as indicated above.  Also azithromycin which she will help.  #7 liver cirrhosis with thrombocytopenia: This is found on CT scan but no ascites.  Patient also has elevated LFTs.  May need GI consultation at discharge   DVT prophylaxis: Heparin   Code Status: Full code   Family Communication: No family available   Disposition Plan: Home   Consults called: None   Admission status: Inpatient   Severity of Illness: The appropriate patient status for this patient is INPATIENT. Inpatient status is judged to be reasonable and necessary in order to provide the required intensity of service to ensure the patient's safety. The patient's presenting symptoms, physical exam findings, and initial radiographic and laboratory data in the context of their chronic comorbidities is felt to place them at high risk for further clinical deterioration. Furthermore, it is not anticipated that the patient will be medically stable for discharge from the hospital within 2 midnights of admission. The following factors support the patient status of inpatient.   " The patient's presenting symptoms include abdominal pain nausea vomiting. " The worrisome physical exam findings include decreased air entry with bilateral abdominal tenderness. " The initial  radiographic and laboratory data are worrisome because of CT scan showing pneumonia. " The chronic co-morbidities include liver cirrhosis as seen on CT.   * I certify that at the point of admission it is my clinical judgment that the patient will require inpatient hospital care spanning beyond 2 midnights from the point of admission due to high intensity of service, high risk for further deterioration and high frequency of surveillance required.Lonia Blood MD [facility] Pager [phone]  If  7PM-7AM, please contact night-coverage [email or web address] Password [email or web address]  08/10/2018, 6:20 PM

## 2018-08-11 DIAGNOSIS — A419 Sepsis, unspecified organism: Principal | ICD-10-CM

## 2018-08-11 LAB — COMPREHENSIVE METABOLIC PANEL
ALBUMIN: 2.5 g/dL — AB (ref 3.5–5.0)
ALK PHOS: 273 U/L — AB (ref 38–126)
ALT: 65 U/L — ABNORMAL HIGH (ref 0–44)
AST: 87 U/L — ABNORMAL HIGH (ref 15–41)
Anion gap: 5 (ref 5–15)
BUN: 12 mg/dL (ref 6–20)
CALCIUM: 7.7 mg/dL — AB (ref 8.9–10.3)
CO2: 22 mmol/L (ref 22–32)
Chloride: 113 mmol/L — ABNORMAL HIGH (ref 98–111)
Creatinine, Ser: 0.97 mg/dL (ref 0.44–1.00)
GFR calc non Af Amer: >60 mL/min (ref >60)
GLUCOSE: 96 mg/dL (ref 70–99)
POTASSIUM: 3.7 mmol/L (ref 3.5–5.1)
SODIUM: 140 mmol/L (ref 135–145)
TOTAL PROTEIN: 7.5 g/dL (ref 6.5–8.1)
Total Bilirubin: 2.3 mg/dL — ABNORMAL HIGH (ref 0.3–1.2)

## 2018-08-11 LAB — CBC WITH DIFFERENTIAL/PLATELET
BASOS ABS: 0 10*3/uL (ref 0.0–0.1)
BASOS PCT: 0 %
EOS ABS: 0.3 10*3/uL (ref 0.0–0.7)
Eosinophils Relative: 2 %
HEMATOCRIT: 32.1 % — AB (ref 36.0–46.0)
Hemoglobin: 11 g/dL — ABNORMAL LOW (ref 12.0–15.0)
LYMPHS PCT: 12 %
Lymphs Abs: 2 10*3/uL (ref 0.7–4.0)
MCH: 29.3 pg (ref 26.0–34.0)
MCHC: 34.3 g/dL (ref 30.0–36.0)
MCV: 85.4 fL (ref 78.0–100.0)
MONOS PCT: 7 %
Monocytes Absolute: 1.2 10*3/uL — ABNORMAL HIGH (ref 0.1–1.0)
NEUTROS ABS: 13.6 10*3/uL — AB (ref 1.7–7.7)
NEUTROS PCT: 79 %
Platelets: 119 10*3/uL — ABNORMAL LOW (ref 150–400)
RBC: 3.76 MIL/uL — AB (ref 3.87–5.11)
RDW: 14.6 % (ref 11.5–15.5)
WBC: 17.1 10*3/uL — ABNORMAL HIGH (ref 4.0–10.5)

## 2018-08-11 LAB — HIV ANTIBODY (ROUTINE TESTING W REFLEX): HIV Screen 4th Generation wRfx: NONREACTIVE

## 2018-08-11 LAB — MAGNESIUM: MAGNESIUM: 1.6 mg/dL — AB (ref 1.7–2.4)

## 2018-08-11 MED ORDER — HYDROCORTISONE NA SUCCINATE PF 100 MG IJ SOLR
50.0000 mg | Freq: Two times a day (BID) | INTRAMUSCULAR | Status: DC
  Administered 2018-08-11 – 2018-08-12 (×3): 50 mg via INTRAVENOUS
  Filled 2018-08-11 (×3): qty 2

## 2018-08-11 MED ORDER — GUAIFENESIN-DM 100-10 MG/5ML PO SYRP
5.0000 mL | ORAL_SOLUTION | ORAL | Status: DC | PRN
  Administered 2018-08-11: 5 mL via ORAL
  Filled 2018-08-11: qty 10

## 2018-08-11 MED ORDER — MAGNESIUM SULFATE 2 GM/50ML IV SOLN
2.0000 g | Freq: Once | INTRAVENOUS | Status: AC
  Administered 2018-08-11: 2 g via INTRAVENOUS
  Filled 2018-08-11: qty 50

## 2018-08-11 MED ORDER — ONDANSETRON 4 MG PO TBDP
4.0000 mg | ORAL_TABLET | Freq: Two times a day (BID) | ORAL | Status: DC
  Administered 2018-08-11 – 2018-08-12 (×3): 4 mg via ORAL
  Filled 2018-08-11 (×3): qty 1

## 2018-08-11 MED ORDER — LEVOFLOXACIN IN D5W 750 MG/150ML IV SOLN
750.0000 mg | INTRAVENOUS | Status: DC
  Administered 2018-08-11: 750 mg via INTRAVENOUS
  Filled 2018-08-11 (×2): qty 150

## 2018-08-11 NOTE — Progress Notes (Signed)
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 Pollyann Savoy constipation Mild MR managed by [place] [facility] Cirrhosis without ascites Hypertension Admitted from med center [facility] 08/11/2018--recently had been at [facility] emergency room 8/27 found to have potential dysuria/BV and hence treated with MetroGel, Levaquin  On reevaluation at med center white count 17,000 platelets 120 sodium 127 BUN/creatinine 11/1.2,  T-max was 103 and tachycardia into the 150s lactic acid was elevated-given 3 L of fluid   A & Plan Sepsis-transition to iv levaquin-check cbc am-stress dose Cortef [on unusual dosing from Liver MD]--lactic acidosis resolving-cont saline 75 cc/h-will need stable dose on d/c as doesn't have Hepatologist currently AKI on admit-slowly resolved Autoimmune hepatitis not on therapy at present-prednisone -->cortef-monitor overnight Prior history gastropathy esophagitis 2017-stable HTN-low normal and hypotensive today-watch trends Cirrhosis-LFT normalizing some-no Fluid wave and low suspicion for SBP Hypomagensiemia--Replaced IV Nausea-added zofran Mild MR-lives with cousin-high functining    Full code, ip, d.w familyexpect 104 hours   Verlon Au, MD  [facility] Direct contact: [phone] --Via amion app OR  --[email or web address]; password [email or web address]  7PM-7AM contact night coverage as above 08/11/2018, 8:07 AM  LOS: 1 day   Consultants:  nad  Procedures:  no  Antimicrobials:  levaquin  Interval history/Subjective: Awake alert nad Walking in room No cp No fever no chills no n/v  Objective:  Vitals:  Vitals:   08/10/18 2027 08/11/18 0451  BP: (!) 92/59 96/74  Pulse:  (!) 107 99  Resp:  20  Temp:  98.4 F (36.9 C)  SpO2: 94% 97%    Exam:  . Awake alert pleasant no ict no pallor . No thryomegally . cta b no added sound . abd soft nt nd no rebound no guard . Neuro intact . Skin intact .    I have personally reviewed the following:   Labs:  Alk - 273, ast/alt down from prior  Wbc 17, plt 19mhb 11 nadf  Imaging studies:  n  Medical tests:  n   Test discussed with performing physician:  n  Decision to obtain old records:  n  Review and summation of old records:  n  Scheduled Meds: . heparin  5,000 Units Subcutaneous Q8H   Continuous Infusions: . sodium chloride 250 mL (08/10/18 1433)  . sodium chloride 250 mL (08/10/18 1438)  . sodium chloride 75 mL/hr at 08/11/18 1446  . levofloxacin (LEVAQUIN) IV Stopped (08/11/18 1450)    Principal Problem:   Sepsis ([place] Active Problems:   Pneumonia   Hepatic cirrhosis ([place]   Hyponatremia   ARF (acute renal failure) (HCC)   Leucocytosis   Enteritis   LOS: 1 day

## 2018-08-11 NOTE — Care Management Note (Signed)
Case Management Note  Patient Details  Name: Heather Bass MRN: [id number] Date of Birth: 08-08-1971  Subjective/Objective: From home w/family support. Has pcp,pharmacy. Patient aware to f/u w/[facility] for her auto immune disease. No CM needs.                  Action/Plan:d/c home.   Expected Discharge Date:                  Expected Discharge Plan:  Home/Self Care  In-House Referral:     Discharge planning Services  CM Consult  Post Acute Care Choice:    Choice offered to:     DME Arranged:    DME Agency:     HH Arranged:    HH Agency:     Status of Service:  In process, will continue to follow  If discussed at Long Length of Stay Meetings, dates discussed:    Additional Comments:  Lanier ClamMahabir, Charnelle Bergeman, RN 08/11/2018, 11:32 AM

## 2018-08-11 NOTE — Plan of Care (Signed)
VSS, afebrile this shift.  Patient medicated for nausea x 1 with improvement.  Maintains oxygen level in the mid to high 90's on room air.  Cousin at bedside.

## 2018-08-12 LAB — CBC WITH DIFFERENTIAL/PLATELET
BASOS ABS: 0 10*3/uL (ref 0.0–0.1)
BASOS PCT: 0 %
EOS ABS: 0 10*3/uL (ref 0.0–0.7)
Eosinophils Relative: 0 %
HCT: 32.3 % — ABNORMAL LOW (ref 36.0–46.0)
HEMOGLOBIN: 10.9 g/dL — AB (ref 12.0–15.0)
Lymphocytes Relative: 11 %
Lymphs Abs: 1.2 10*3/uL (ref 0.7–4.0)
MCH: 29 pg (ref 26.0–34.0)
MCHC: 33.7 g/dL (ref 30.0–36.0)
MCV: 85.9 fL (ref 78.0–100.0)
MONOS PCT: 5 %
Monocytes Absolute: 0.6 10*3/uL (ref 0.1–1.0)
NEUTROS PCT: 84 %
Neutro Abs: 9.2 10*3/uL — ABNORMAL HIGH (ref 1.7–7.7)
Platelets: 132 10*3/uL — ABNORMAL LOW (ref 150–400)
RBC: 3.76 MIL/uL — ABNORMAL LOW (ref 3.87–5.11)
RDW: 14.7 % (ref 11.5–15.5)
WBC: 11 10*3/uL — AB (ref 4.0–10.5)

## 2018-08-12 LAB — COMPREHENSIVE METABOLIC PANEL
ALT: 56 U/L — ABNORMAL HIGH (ref 0–44)
ANION GAP: 4 — AB (ref 5–15)
AST: 65 U/L — ABNORMAL HIGH (ref 15–41)
Albumin: 2.4 g/dL — ABNORMAL LOW (ref 3.5–5.0)
Alkaline Phosphatase: 250 U/L — ABNORMAL HIGH (ref 38–126)
BILIRUBIN TOTAL: 1.1 mg/dL (ref 0.3–1.2)
BUN: 9 mg/dL (ref 6–20)
CO2: 23 mmol/L (ref 22–32)
Calcium: 8 mg/dL — ABNORMAL LOW (ref 8.9–10.3)
Chloride: 113 mmol/L — ABNORMAL HIGH (ref 98–111)
Creatinine, Ser: 0.8 mg/dL (ref 0.44–1.00)
Glucose, Bld: 127 mg/dL — ABNORMAL HIGH (ref 70–99)
POTASSIUM: 4.1 mmol/L (ref 3.5–5.1)
SODIUM: 140 mmol/L (ref 135–145)
TOTAL PROTEIN: 7.5 g/dL (ref 6.5–8.1)

## 2018-08-12 LAB — MAGNESIUM: MAGNESIUM: 2.3 mg/dL (ref 1.7–2.4)

## 2018-08-12 LAB — PROTIME-INR
INR: 1.5
PROTHROMBIN TIME: 18 s — AB (ref 11.4–15.2)

## 2018-08-12 MED ORDER — PREDNISONE 20 MG PO TABS: 20.0000 mg | ORAL_TABLET | Freq: Every day | ORAL | 0 refills | Status: AC

## 2018-08-12 NOTE — Care Management Note (Signed)
Case Management Note  Patient Details  Name: Heather Bass MRN: [id number] Date of Birth: [birth year]  Subjective/Objective: No further CM needs.                   Action/Plan:d/c home.   Expected Discharge Date:  08/12/18               Expected Discharge Plan:  Home/Self Care  In-House Referral:     Discharge planning Services  CM Consult  Post Acute Care Choice:    Choice offered to:     DME Arranged:    DME Agency:     HH Arranged:    HH Agency:     Status of Service:  Completed, signed off  If discussed at [employer] Length of Stay Meetings, dates discussed:    Additional Comments:  Lanier ClamMahabir, Ernesteen Mihalic, RN 08/12/2018, 10:14 AM

## 2018-08-12 NOTE — Progress Notes (Signed)
Pt to be discharged to home today. Pt given discharge teaching including Medication instructions and schedules of Medications. Pt verbalized understanding of all discharge teaching/instructions. VSS No acute changes noted with Pt's assessment at time of dishcarge

## 2018-08-12 NOTE — Discharge Summary (Signed)
Physician Discharge Summary  Heather Bass RUE:[id number] DOB: 01/07/1971 DOA: 08/10/2018  PCP: [facility]  Admit date: 08/10/2018 Discharge date: 08/12/2018  Time spent: 25 minutes  Recommendations for Outpatient Follow-up:  1. Complete course of Levaquin that was prescribed as an outpatient 2. Complete Flagyl gel for BV 3. 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 Donnajean Lopes constipation Mild MR managed by [facility] Cirrhosis without ascites Hypertension Admitted from med center [facility] 08/11/2018--recently had been at [facility] room 8/27 found to have potential dysuria/BV and hence treated with MetroGel, Levaquin  On reevaluation at med center white count 17,000 platelets 120 sodium 127 BUN/creatinine 11/1.2,  T-max was 103 and tachycardia into the 150s lactic acid was elevated-given 3 L of fluid   Hospital Course:  Sepsis-transition to iv levaquin-check cbc am-stress dose Cortef was given-patient was transitioned to p.o. Levaquin white count resolved patient stabilized can complete home medications as an outpatient Consider outpatient chest x-ray AKI on admit-slowly resolved Autoimmune hepatitis not on therapy at present-prednisone -->cortef-monitor overnight--reviewed chart-unusual  dosing of steroids-giving 30-day supply prednisone and outpatient follow-up needed Prior history gastropathy esophagitis 2017-stable HTN-low normal and hypotensive today-watch trends Cirrhosis-LFT normalizing some-no Fluid wave and low suspicion for SBP-bilirubin dropped from 2-1.1 on discharge Hypomagensiemia--Replaced IV Nausea-added zofran Mild MR-lives with cousin-high functining    Discharge Exam: Vitals:   08/11/18 2003 08/12/18 0402  BP: 108/68 106/74  Pulse: 91 71  Resp: 18 16  Temp: 98.1 F (36.7 C) 98.8 F (37.1 C)  SpO2: 94% 97%    General: awake alert pleasant no distress tolerating diet sitting up in bed no fever no chills Cardiovascular: 1 S2 no murmur rub or gallop  Respiratory: Clinically clear mild crackles no rales no rhonchi Abdomen soft nontender no lower extremity edema  Discharge Instructions   Discharge Instructions    Diet - low sodium heart healthy   Complete by:  As directed    Discharge instructions   Complete by:  As directed    Complete levaquin course for Pneumonia and metrogel for vaginal discharge-you should be on prednisone chronically at 20 mg daily--do not taper and follow up with Hepatologist for further instructions on how to taper in 2-3 weeks   Increase activity slowly   Complete by:  As directed      Allergies as of 08/12/2018   No Known Allergies     Medication List    STOP taking these medications   cephALEXin 500 MG capsule Commonly known as:  KEFLEX   metroNIDAZOLE 0.75 % vaginal gel Commonly known as:  METROGEL   metroNIDAZOLE 500 MG tablet Commonly known as:  FLAGYL     TAKE these medications   albuterol 108 (90 Base) MCG/ACT inhaler Commonly known as:  PROVENTIL HFA;VENTOLIN HFA Inhale 1-2 puffs into the lungs every 6 (six) hours as needed for wheezing or  shortness of breath (cough).   levofloxacin 500 MG tablet Commonly known as:  LEVAQUIN Take 1 tablet (500 mg total) by mouth daily.   ondansetron 4 MG  disintegrating tablet Commonly known as:  ZOFRAN-ODT Take 1 tablet (4 mg total) by mouth every 4 (four) hours as needed for nausea or vomiting.   pantoprazole 20 MG tablet Commonly known as:  PROTONIX Take 1 tablet (20 mg total) by mouth daily.   predniSONE 20 MG tablet Commonly known as:  DELTASONE Take 1 tablet (20 mg total) by mouth daily. Notes to patient:  Please start this Medication at home once Prescription is filled       No Known Allergies    The results of significant diagnostics from this hospitalization (including imaging, microbiology, ancillary and laboratory) are listed below for reference.    Significant Diagnostic Studies: Dg Chest 2 View  Result Date: 08/09/2018 CLINICAL DATA:  Cough, congestion EXAM: CHEST - 2 VIEW COMPARISON:  06/02/2018 FINDINGS: Heart and mediastinal contours are within normal limits. No focal opacities or effusions. No acute bony abnormality. IMPRESSION: No active cardiopulmonary disease. Electronically Signed   By: Charlett NoseKevin  Dover M.D.   On: 08/09/2018 01:15   Ct Abdomen Pelvis W Contrast  Result Date: 08/10/2018 CLINICAL DATA:  RIGHT lower quadrant pain, nausea, vomiting and diarrhea for 2 weeks. History of cirrhosis, hysterectomy and cholecystectomy. EXAM: CT ABDOMEN AND PELVIS WITH CONTRAST TECHNIQUE: Multidetector CT imaging of the abdomen and pelvis was performed using the standard protocol following bolus administration of intravenous contrast. CONTRAST:  100mL ISOVUE-300 IOPAMIDOL (ISOVUE-300) INJECTION 61% COMPARISON:  Chest radiograph August 09, 2018. CT chest report dated February 26, 2016 though images are not available for direct comparison. FINDINGS: LOWER CHEST: Dense consolidation RIGHT lower lobe with air bronchograms. No pleural effusion. HEPATOBILIARY: Nodular cirrhotic liver with mild LEFT lobe biliary dilatation. 2 cm hypodensity RIGHT lobe of the liver segment 5 with surrounding density and status post cholecystectomy. PANCREAS:  Normal. SPLEEN: Mild splenomegaly, punctate calcified granuloma. ADRENALS/URINARY TRACT: Kidneys are orthotopic, demonstrating symmetric enhancement. No nephrolithiasis, hydronephrosis or solid renal masses. The unopacified ureters are normal in course and caliber. Delayed imaging through the kidneys demonstrates symmetric prompt contrast excretion within the proximal urinary collecting system. Urinary bladder is partially distended and unremarkable. Normal adrenal glands. STOMACH/BOWEL: Small hiatal hernia. Fluid filled small bowel. Colonic air-fluid levels. Bowel is normal in course and caliber. VASCULAR/LYMPHATIC: Aortoiliac vessels are normal in course and caliber. Small periumbilical varices. 17 mm cardio phrenic angle lymph node. Mild portal caval in porta hepatis lymphadenopathy is likely reactive. REPRODUCTIVE: Status post hysterectomy. OTHER: No intraperitoneal free fluid or free air. MUSCULOSKELETAL: Nonacute. Small fat containing umbilical hernia. Moderate L5-S1 degenerative disc. IMPRESSION: 1. RIGHT lower lobe pneumonia. Mild lymphadenopathy may be reactive though recommend close attention on follow-up imaging. 2. Small and large bowel air-fluid levels most compatible seen with enteritis. Normal appendix. 3. Cirrhosis and portal hypertension, no ascites. Nodular densities RIGHT lobe of the liver previously attributed to fibrosis and transient hepatic attenuation though images are not available for comparison. When submitted, an addendum will be made to this report, recommendation may alter after comparison with prior. Electronically Signed   By: Awilda Metroourtnay  Bloomer M.D.   On: 08/10/2018 14:57    Microbiology: Recent Results (from the past 240 hour(s))  Urine culture     Status: None   Collection Time: 08/09/18  4:52 AM  Result Value Ref Range Status   Specimen Description URINE, CATHETERIZED  Final   Special  Requests NONE  Final   Culture   Final    NO GROWTH Performed at [facility], [address] [place], [place] [address]    Report Status 08/10/2018 FINAL  Final  Wet prep, genital     Status: Abnormal   Collection Time: 08/09/18  4:53 AM  Result Value Ref Range Status   Yeast Wet Prep HPF POC NONE SEEN NONE SEEN Final   Trich, Wet Prep NONE SEEN NONE SEEN Final   Clue Cells Wet Prep HPF POC PRESENT (A) NONE SEEN Final   WBC, Wet Prep HPF POC MODERATE (A) NONE SEEN Final   Sperm NONE SEEN  Final    Comment: Performed at [facility], [address] [place], [place] [address]  Blood culture (routine x 2)     Status: None (Preliminary result)   Collection Time: 08/10/18  1:20 PM  Result Value Ref Range Status   Specimen Description   Final    BLOOD LEFT ANTECUBITAL Performed at [facility], [address] [place], [place] [address]    Special Requests   Final    BOTTLES DRAWN AEROBIC AND ANAEROBIC Blood Culture adequate volume Performed at [facility], [address] [place], [place] [address]    Culture   Final    NO GROWTH < 24 HOURS Performed at [facility], [address], [place], [place] [address]    Report Status PENDING  Incomplete  Blood culture (routine x 2)     Status: None (Preliminary result)   Collection Time: 08/10/18  1:30 PM  Result Value Ref Range Status   Specimen Description   Final    BLOOD RIGHT ANTECUBITAL Performed at [facility], [address] [place], [place] [address]    Special Requests   Final    BOTTLES DRAWN AEROBIC AND ANAEROBIC Blood Culture adequate volume Performed at [facility], [address] [place], [place] [address]    Culture   Final    NO GROWTH < 24 HOURS Performed at [facility], [address] [place], [place] [address]    Report Status PENDING  Incomplete  Culture, blood (routine x 2) Call MD if unable to obtain prior to antibiotics being given     Status: None (Preliminary result)   Collection Time: 08/10/18  7:23 PM  Result  Value Ref Range Status   Specimen Description   Final    BLOOD LEFT HAND Performed at [facility], [address], [place], [place] [address]    Special Requests   Final    BOTTLES DRAWN AEROBIC AND ANAEROBIC Blood Culture adequate volume Performed at [facility], [address], [place], [place] [address]    Culture   Final    NO GROWTH < 12 HOURS Performed at [facility], [address], [place], [place] [address]    Report Status PENDING  Incomplete  Culture, blood (routine x 2) Call MD if unable to obtain prior to antibiotics being given     Status: None (Preliminary result)   Collection Time: 08/10/18  7:32 PM  Result Value Ref Range Status   Specimen Description   Final    BLOOD RIGHT HAND Performed at [facility], [address] [place], [place] [address]    Special Requests   Final    BOTTLES DRAWN AEROBIC AND ANAEROBIC Blood  Culture adequate volume Performed at [facility], [address] [place], [place] [address]    Culture   Final    NO GROWTH < 12 HOURS Performed at [facility], [address], [place], [place] [address]    Report Status PENDING  Incomplete     Labs: Basic Metabolic Panel: Recent Labs  Lab 08/10/18 1325 08/10/18 1923 08/11/18 0526 08/12/18 0514  NA 127*  --  140 140  K 3.4*  --  3.7 4.1  CL 95*  --  113* 113*  CO2 20*  --  22 23  GLUCOSE 299*  --  96 127*  BUN 11  --  12 9  CREATININE 1.26* 1.10* 0.97 0.80  CALCIUM 8.1*  --  7.7* 8.0*  MG  --   --  1.6* 2.3   Liver Function Tests: Recent Labs  Lab 08/10/18 1325 08/11/18 0526 08/12/18 0514  AST 155* 87* 65*  ALT 86* 65* 56*  ALKPHOS 328* 273* 250*  BILITOT 2.6* 2.3* 1.1  PROT 8.9* 7.5 7.5  ALBUMIN 3.0* 2.5* 2.4*   Recent Labs  Lab 08/10/18 1325  LIPASE 25   No results for input(s): AMMONIA in the last 168 hours. CBC: Recent Labs  Lab 08/10/18 1325 08/11/18 0526  08/12/18 0514  WBC 17.0* 17.1* 11.0*  NEUTROABS 14.1* 13.6* 9.2*  HGB 12.2 11.0* 10.9*  HCT 34.9* 32.1* 32.3*  MCV 84.9 85.4 85.9  PLT 120* 119* 132*   Cardiac Enzymes: No results for input(s): CKTOTAL, CKMB, CKMBINDEX, TROPONINI in the last 168 hours. BNP: BNP (last 3 results) Recent Labs    06/02/18 0329  BNP 17.7    ProBNP (last 3 results) No results for input(s): PROBNP in the last 8760 hours.  CBG: No results for input(s): GLUCAP in the last 168 hours.     Signed:  Rhetta Mura MD   [facility] 08/12/2018, 10:04 AM

## 2018-08-15 LAB — CULTURE, BLOOD (ROUTINE X 2)
CULTURE: NO GROWTH
Culture: NO GROWTH
Culture: NO GROWTH
Culture: NO GROWTH
SPECIAL REQUESTS: ADEQUATE
SPECIAL REQUESTS: ADEQUATE
Special Requests: ADEQUATE
Special Requests: ADEQUATE

## 2018-10-10 ENCOUNTER — Emergency Department (HOSPITAL_BASED_OUTPATIENT_CLINIC_OR_DEPARTMENT_OTHER)
Admission: EM | Admit: 2018-10-10 | Discharge: 2018-10-10 | Disposition: A | Discharge status: Home / Self Care | Payer: Medicare Other | Attending: Emergency Medicine | Admitting: Emergency Medicine

## 2018-10-10 ENCOUNTER — Encounter (HOSPITAL_BASED_OUTPATIENT_CLINIC_OR_DEPARTMENT_OTHER): Payer: Self-pay | Admitting: *Deleted

## 2018-10-10 ENCOUNTER — Emergency Department (HOSPITAL_BASED_OUTPATIENT_CLINIC_OR_DEPARTMENT_OTHER): Payer: Medicare Other

## 2018-10-10 ENCOUNTER — Other Ambulatory Visit: Payer: Self-pay

## 2018-10-10 DIAGNOSIS — Y9389 Activity, other specified: Secondary | ICD-10-CM | POA: Diagnosis not present

## 2018-10-10 DIAGNOSIS — Y92410 Unspecified street and highway as the place of occurrence of the external cause: Secondary | ICD-10-CM | POA: Diagnosis not present

## 2018-10-10 DIAGNOSIS — M542 Cervicalgia: Secondary | ICD-10-CM | POA: Insufficient documentation

## 2018-10-10 DIAGNOSIS — W2210XA Striking against or struck by unspecified automobile airbag, initial encounter: Secondary | ICD-10-CM | POA: Diagnosis not present

## 2018-10-10 DIAGNOSIS — R51 Headache: Secondary | ICD-10-CM | POA: Insufficient documentation

## 2018-10-10 DIAGNOSIS — M545 Low back pain: Secondary | ICD-10-CM | POA: Insufficient documentation

## 2018-10-10 DIAGNOSIS — Y998 Other external cause status: Secondary | ICD-10-CM | POA: Insufficient documentation

## 2018-10-10 MED ORDER — ACETAMINOPHEN 325 MG PO TABS
650.0000 mg | ORAL_TABLET | Freq: Once | ORAL | Status: AC
  Administered 2018-10-10: 650 mg via ORAL
  Filled 2018-10-10: qty 2

## 2018-10-10 MED ORDER — CYCLOBENZAPRINE HCL 10 MG PO TABS: 10.0000 mg | ORAL_TABLET | Freq: Two times a day (BID) | ORAL | 0 refills | Status: AC | PRN
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
## 2018-10-10 NOTE — Discharge Instructions (Signed)
Your imaging today showed no evidence of significant traumatic injury in your head neck or low back.  We suspect you have muscular strain and pain.  Please use the muscle relaxant and use over-the-counter anti-inflammatory medications to help with your discomfort.  The muscle spasm should be alleviated with the muscle relaxant.  Please be careful not to drive and be careful not to fall.  Please follow-up with your primary doctor.  If any symptoms change or worsen, please return to the nearest emergency department.
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
## 2019-08-08 ENCOUNTER — Other Ambulatory Visit: Payer: Self-pay

## 2019-08-08 ENCOUNTER — Emergency Department (HOSPITAL_COMMUNITY)
Admission: EM | Admit: 2019-08-08 | Discharge: 2019-08-09 | Discharge status: Against Medical Advice | Payer: Medicare Other | Attending: Emergency Medicine | Admitting: Emergency Medicine

## 2019-08-08 ENCOUNTER — Emergency Department (HOSPITAL_COMMUNITY): Payer: Medicare Other

## 2019-08-08 DIAGNOSIS — Z5321 Procedure and treatment not carried out due to patient leaving prior to being seen by health care provider: Secondary | ICD-10-CM | POA: Insufficient documentation

## 2019-08-08 DIAGNOSIS — R0789 Other chest pain: Secondary | ICD-10-CM | POA: Diagnosis present

## 2019-08-08 MED ORDER — SODIUM CHLORIDE 0.9% FLUSH: 3.0000 mL | Freq: Once | INTRAVENOUS | Status: DC

## 2019-08-08 NOTE — ED Triage Notes (Signed)
Per pt she has been having chest pain for about 1 week with burning sensation with drinking. Pt said no sob, no nausea and pain doe snot radiate.

## 2019-08-09 LAB — BASIC METABOLIC PANEL
Anion gap: 8 (ref 5–15)
BUN: <5 mg/dL — ABNORMAL LOW (ref 6–20)
CO2: 23 mmol/L (ref 22–32)
Calcium: 8.9 mg/dL (ref 8.9–10.3)
Chloride: 106 mmol/L (ref 98–111)
Creatinine, Ser: 1.1 mg/dL — ABNORMAL HIGH (ref 0.44–1.00)
GFR calc Af Amer: >60 mL/min (ref >60)
GFR calc non Af Amer: 59 mL/min — ABNORMAL LOW (ref >60)
Glucose, Bld: 119 mg/dL — ABNORMAL HIGH (ref 70–99)
Potassium: 3.7 mmol/L (ref 3.5–5.1)
Sodium: 137 mmol/L (ref 135–145)

## 2019-08-09 LAB — TROPONIN I (HIGH SENSITIVITY): Troponin I (High Sensitivity): 4 ng/L (ref <18)

## 2019-08-09 LAB — CBC
HCT: 35.5 % — ABNORMAL LOW (ref 36.0–46.0)
Hemoglobin: 11.5 g/dL — ABNORMAL LOW (ref 12.0–15.0)
MCH: 29.3 pg (ref 26.0–34.0)
MCHC: 32.4 g/dL (ref 30.0–36.0)
MCV: 90.6 fL (ref 80.0–100.0)
Platelets: 123 10*3/uL — ABNORMAL LOW (ref 150–400)
RBC: 3.92 MIL/uL (ref 3.87–5.11)
RDW: 12.5 % (ref 11.5–15.5)
WBC: 4.6 10*3/uL (ref 4.0–10.5)
nRBC: 0 % (ref 0.0–0.2)

## 2019-08-09 NOTE — ED Notes (Signed)
Pt. Not answering for vitals recheck x3. Pulling OTF.

## 2019-08-09 NOTE — ED Notes (Signed)
Pt. Not answring for vitals recheck x2.

## 2020-05-06 ENCOUNTER — Other Ambulatory Visit: Payer: Self-pay | Admitting: Family Medicine

## 2020-05-06 DIAGNOSIS — Z13820 Encounter for screening for osteoporosis: Secondary | ICD-10-CM

## 2020-05-14 ENCOUNTER — Other Ambulatory Visit: Payer: Self-pay | Admitting: Family Medicine

## 2020-05-14 DIAGNOSIS — Z1231 Encounter for screening mammogram for malignant neoplasm of breast: Secondary | ICD-10-CM

## 2020-05-31 ENCOUNTER — Other Ambulatory Visit: Payer: Medicare Other

## 2020-05-31 ENCOUNTER — Ambulatory Visit: Payer: Medicare Other

## 2020-08-23 ENCOUNTER — Other Ambulatory Visit: Payer: Medicare HMO

## 2020-08-23 ENCOUNTER — Ambulatory Visit: Payer: Self-pay
